# Patient Record
Sex: Female | Born: 1990
Health system: Southern US, Community
[De-identification: ages and names within clinical notes are randomized; demographics above are authoritative.]

## PROBLEM LIST (undated history)

## (undated) DIAGNOSIS — I898 Other specified noninfective disorders of lymphatic vessels and lymph nodes: Secondary | ICD-10-CM

## (undated) DIAGNOSIS — R590 Localized enlarged lymph nodes: Secondary | ICD-10-CM

## (undated) HISTORY — DX: Localized enlarged lymph nodes: R59.0

## (undated) HISTORY — DX: Other specified noninfective disorders of lymphatic vessels and lymph nodes: I89.8

---

## 2016-10-26 ENCOUNTER — Other Ambulatory Visit: Payer: Self-pay | Admitting: Student

## 2016-10-26 DIAGNOSIS — M659 Synovitis and tenosynovitis, unspecified: Secondary | ICD-10-CM

## 2016-11-03 ENCOUNTER — Ambulatory Visit: Payer: Medicaid Other

## 2016-12-15 ENCOUNTER — Encounter: Payer: Self-pay | Admitting: Radiology

## 2016-12-15 ENCOUNTER — Ambulatory Visit
Admission: RE | Admit: 2016-12-15 | Discharge: 2016-12-15 | Disposition: A | Discharge status: Home / Self Care | Payer: Medicaid Other | Source: Ambulatory Visit | Attending: Student | Admitting: Student

## 2016-12-15 DIAGNOSIS — Z09 Encounter for follow-up examination after completed treatment for conditions other than malignant neoplasm: Secondary | ICD-10-CM | POA: Diagnosis present

## 2016-12-15 DIAGNOSIS — M659 Synovitis and tenosynovitis, unspecified: Secondary | ICD-10-CM

## 2016-12-15 DIAGNOSIS — M25472 Effusion, left ankle: Secondary | ICD-10-CM | POA: Diagnosis not present

## 2017-01-01 DIAGNOSIS — M659 Synovitis and tenosynovitis, unspecified: Secondary | ICD-10-CM | POA: Insufficient documentation

## 2017-01-25 ENCOUNTER — Other Ambulatory Visit: Payer: Self-pay

## 2017-01-25 ENCOUNTER — Encounter
Admission: RE | Admit: 2017-01-25 | Discharge: 2017-01-25 | Disposition: A | Discharge status: Home / Self Care | Payer: Medicaid Other | Source: Ambulatory Visit | Attending: Surgery | Admitting: Surgery

## 2017-01-25 NOTE — Patient Instructions (Signed)
  Your procedure is scheduled on: 02-01-17 Report to Same Day Surgery 2nd floor medical mall [facility](Medical Mall Entrance-take elevator on left to 2nd floor.  Check in with surgery information desk.) To find out your arrival time please call [phone] between 1PM - 3PM on 01-31-17  Remember: Instructions that are not followed completely may result in serious medical risk, up to and including death, or upon the discretion of your surgeon and anesthesiologist your surgery may need to be rescheduled.    _x___ 1. Do not eat food after midnight the night before your procedure. NO GUM OR CANDY AFTER MIDNIGHT.You may drink clear liquids up to 2 hours before you are scheduled to arrive at the hospital for your procedure.  Do not drink clear liquids within 2 hours of your scheduled arrival to the hospital.  Clear liquids include  --Water or Apple juice without pulp  --Clear carbohydrate beverage such as ClearFast or Gatorade  --Black Coffee or Clear Tea (No milk, no creamers, do not add anything to the coffee or Tea      __x__ 2. No Alcohol for 24 hours before or after surgery.   __x__3. No Smoking for 24 prior to surgery.   ____  4. Bring all medications with you on the day of surgery if instructed.    __x__ 5. Notify your doctor if there is any change in your medical condition     (cold, fever, infections).     Do not wear jewelry, make-up, hairpins, clips or nail polish.  Do not wear lotions, powders, or perfumes. You may wear deodorant.  Do not shave 48 hours prior to surgery. Men may shave face and neck.  Do not bring valuables to the hospital.    [facility] is not responsible for any belongings or valuables.               Contacts, dentures or bridgework may not be worn into surgery.  Leave your suitcase in the car. After surgery it may be brought to your room.  For patients admitted to the hospital, discharge time is determined by your treatment team.   Patients discharged the day of  surgery will not be allowed to drive home.  You will need someone to drive you home and stay with you the night of your procedure.    ____ Take anti-hypertensive listed below, cardiac, seizure, asthma,  anti-reflux and psychiatric medicines. These include:  1. NONE  2.  3.  4.  5.  6.  ____Fleets enema or Magnesium Citrate as directed.   _x___ Use CHG Soap or sage wipes as directed on instruction sheet   ____ Use inhalers on the day of surgery and bring to hospital day of surgery  ____ Stop Metformin and Janumet 2 days prior to surgery.    ____ Take 1/2 of usual insulin dose the night before surgery and none on the morning surgery.   ____ Follow recommendations from Cardiologist, Pulmonologist or PCP regarding stopping Aspirin, Coumadin, Plavix ,Eliquis, Effient, or Pradaxa, and Pletal.  X____Stop Anti-inflammatories such as Advil, Aleve, IBUPROFEN, Motrin, Naproxen, Naprosyn, Goodies powders or aspirin products NOW-OK to take Tylenol   ____ Stop supplements until after surgery.    ____ Bring C-Pap to the hospital.

## 2017-02-01 ENCOUNTER — Other Ambulatory Visit: Payer: Self-pay

## 2017-02-01 ENCOUNTER — Ambulatory Visit: Payer: Medicaid Other | Admitting: Anesthesiology

## 2017-02-01 ENCOUNTER — Encounter
Admission: RE | Disposition: A | Discharge status: Home / Self Care | Payer: Self-pay | Source: Ambulatory Visit | Attending: Surgery

## 2017-02-01 ENCOUNTER — Encounter: Payer: Self-pay | Admitting: *Deleted

## 2017-02-01 ENCOUNTER — Ambulatory Visit
Admission: RE | Admit: 2017-02-01 | Discharge: 2017-02-01 | Disposition: A | Discharge status: Home / Self Care | Payer: Medicaid Other | Source: Ambulatory Visit | Attending: Surgery | Admitting: Surgery

## 2017-02-01 DIAGNOSIS — M659 Synovitis and tenosynovitis, unspecified: Secondary | ICD-10-CM | POA: Diagnosis not present

## 2017-02-01 DIAGNOSIS — M25872 Other specified joint disorders, left ankle and foot: Secondary | ICD-10-CM | POA: Insufficient documentation

## 2017-02-01 HISTORY — PX: ANKLE ARTHROSCOPY: SHX545

## 2017-02-01 LAB — POCT PREGNANCY, URINE: Preg Test, Ur: NEGATIVE

## 2017-02-01 SURGERY — ARTHROSCOPY, ANKLE
Anesthesia: General | Site: Ankle | Laterality: Left | Wound class: Clean

## 2017-02-01 MED ORDER — FAMOTIDINE 20 MG PO TABS
ORAL_TABLET | ORAL | Status: AC
  Administered 2017-02-01: 20 mg
  Filled 2017-02-01: qty 1

## 2017-02-01 MED ORDER — BUPIVACAINE-EPINEPHRINE (PF) 0.5% -1:200000 IJ SOLN
INTRAMUSCULAR | Status: DC | PRN
  Administered 2017-02-01: 15 mL via PERINEURAL

## 2017-02-01 MED ORDER — FAMOTIDINE 20 MG PO TABS: 20.0000 mg | ORAL_TABLET | Freq: Once | ORAL | Status: DC

## 2017-02-01 MED ORDER — LIDOCAINE HCL (PF) 2 % IJ SOLN
INTRAMUSCULAR | Status: AC
  Filled 2017-02-01: qty 10

## 2017-02-01 MED ORDER — CEFAZOLIN SODIUM-DEXTROSE 2-4 GM/100ML-% IV SOLN
INTRAVENOUS | Status: AC
  Filled 2017-02-01: qty 100

## 2017-02-01 MED ORDER — BUPIVACAINE-EPINEPHRINE (PF) 0.5% -1:200000 IJ SOLN
INTRAMUSCULAR | Status: AC
  Filled 2017-02-01: qty 30

## 2017-02-01 MED ORDER — LACTATED RINGERS IV SOLN
INTRAVENOUS | Status: DC
  Administered 2017-02-01: 12:00:00 via INTRAVENOUS

## 2017-02-01 MED ORDER — KETOROLAC TROMETHAMINE 30 MG/ML IJ SOLN
INTRAMUSCULAR | Status: AC
  Filled 2017-02-01: qty 1

## 2017-02-01 MED ORDER — ONDANSETRON HCL 4 MG/2ML IJ SOLN
INTRAMUSCULAR | Status: DC | PRN
Start: 2017-02-01 — End: 2017-02-01
  Administered 2017-02-01: 4 mg via INTRAVENOUS

## 2017-02-01 MED ORDER — PHENYLEPHRINE HCL 10 MG/ML IJ SOLN
INTRAMUSCULAR | Status: DC | PRN
  Administered 2017-02-01 (×4): 100 ug via INTRAVENOUS

## 2017-02-01 MED ORDER — FENTANYL CITRATE (PF) 100 MCG/2ML IJ SOLN: 25.0000 ug | INTRAMUSCULAR | Status: DC | PRN

## 2017-02-01 MED ORDER — LIDOCAINE HCL (PF) 1 % IJ SOLN
INTRAMUSCULAR | Status: AC
  Filled 2017-02-01: qty 30

## 2017-02-01 MED ORDER — LIDOCAINE HCL 1 % IJ SOLN
INTRAMUSCULAR | Status: DC | PRN
  Administered 2017-02-01: 15 mL

## 2017-02-01 MED ORDER — HYDROCODONE-ACETAMINOPHEN 5-325 MG PO TABS: 1.0000 | ORAL_TABLET | Freq: Four times a day (QID) | ORAL | 0 refills | Status: DC | PRN

## 2017-02-01 MED ORDER — MIDAZOLAM HCL 2 MG/2ML IJ SOLN
INTRAMUSCULAR | Status: DC | PRN
  Administered 2017-02-01: 2 mg via INTRAVENOUS

## 2017-02-01 MED ORDER — KETOROLAC TROMETHAMINE 30 MG/ML IJ SOLN
INTRAMUSCULAR | Status: DC | PRN
Start: 2017-02-01 — End: 2017-02-01
  Administered 2017-02-01: 30 mg via INTRAVENOUS

## 2017-02-01 MED ORDER — OXYCODONE HCL 5 MG PO TABS: 5.0000 mg | ORAL_TABLET | Freq: Once | ORAL | Status: DC | PRN

## 2017-02-01 MED ORDER — LIDOCAINE HCL (CARDIAC) 20 MG/ML IV SOLN
INTRAVENOUS | Status: DC | PRN
  Administered 2017-02-01: 40 mg via INTRAVENOUS

## 2017-02-01 MED ORDER — FENTANYL CITRATE (PF) 100 MCG/2ML IJ SOLN
INTRAMUSCULAR | Status: DC | PRN
  Administered 2017-02-01 (×2): 25 ug via INTRAVENOUS

## 2017-02-01 MED ORDER — DEXAMETHASONE SODIUM PHOSPHATE 10 MG/ML IJ SOLN
INTRAMUSCULAR | Status: AC
  Filled 2017-02-01: qty 1

## 2017-02-01 MED ORDER — PROPOFOL 10 MG/ML IV BOLUS
INTRAVENOUS | Status: AC
  Filled 2017-02-01: qty 20

## 2017-02-01 MED ORDER — MIDAZOLAM HCL 2 MG/2ML IJ SOLN
INTRAMUSCULAR | Status: AC
  Filled 2017-02-01: qty 2

## 2017-02-01 MED ORDER — OXYCODONE HCL 5 MG/5ML PO SOLN: 5.0000 mg | Freq: Once | ORAL | Status: DC | PRN

## 2017-02-01 MED ORDER — DEXAMETHASONE SODIUM PHOSPHATE 10 MG/ML IJ SOLN
INTRAMUSCULAR | Status: DC | PRN
Start: 2017-02-01 — End: 2017-02-01
  Administered 2017-02-01: 10 mg via INTRAVENOUS

## 2017-02-01 MED ORDER — CEFAZOLIN SODIUM-DEXTROSE 2-4 GM/100ML-% IV SOLN
2.0000 g | Freq: Once | INTRAVENOUS | Status: AC
  Administered 2017-02-01: 2 g via INTRAVENOUS

## 2017-02-01 MED ORDER — PROPOFOL 10 MG/ML IV BOLUS
INTRAVENOUS | Status: DC | PRN
  Administered 2017-02-01: 40 mg via INTRAVENOUS
  Administered 2017-02-01: 120 mg via INTRAVENOUS

## 2017-02-01 MED ORDER — ONDANSETRON HCL 4 MG/2ML IJ SOLN
INTRAMUSCULAR | Status: AC
  Filled 2017-02-01: qty 2

## 2017-02-01 MED ORDER — FENTANYL CITRATE (PF) 100 MCG/2ML IJ SOLN
INTRAMUSCULAR | Status: AC
  Filled 2017-02-01: qty 4

## 2017-02-01 MED ORDER — GLYCOPYRROLATE 0.2 MG/ML IJ SOLN
INTRAMUSCULAR | Status: DC | PRN
  Administered 2017-02-01: 0.2 mg via INTRAVENOUS

## 2017-02-01 SURGICAL SUPPLY — 38 items
ADAPTER IRRIG TUBE 2 SPIKE SOL (ADAPTER) IMPLANT
BANDAGE ELASTIC 4 LF NS (GAUZE/BANDAGES/DRESSINGS) ×2 IMPLANT
BNDG ESMARK 4X12 TAN STRL LF (GAUZE/BANDAGES/DRESSINGS) ×2 IMPLANT
CASTING MATERIAL DELTA LITE (CAST SUPPLIES) IMPLANT
CHLORAPREP W/TINT 26ML (MISCELLANEOUS) ×2 IMPLANT
CUFF TOURN 18 STER (MISCELLANEOUS) ×2 IMPLANT
CUFF TOURN 24 STER (MISCELLANEOUS) IMPLANT
CUFF TOURN 30 STER DUAL PORT (MISCELLANEOUS) IMPLANT
DRAPE INCISE IOBAN 66X45 STRL (DRAPES) ×2 IMPLANT
GAUZE PETRO XEROFOAM 1X8 (MISCELLANEOUS) IMPLANT
GAUZE SPONGE 4X4 12PLY STRL (GAUZE/BANDAGES/DRESSINGS) ×2 IMPLANT
GLOVE BIO SURGEON STRL SZ8 (GLOVE) ×4 IMPLANT
GLOVE INDICATOR 8.0 STRL GRN (GLOVE) ×2 IMPLANT
GOWN STRL REUS W/ TWL LRG LVL3 (GOWN DISPOSABLE) ×1 IMPLANT
GOWN STRL REUS W/ TWL XL LVL3 (GOWN DISPOSABLE) ×1 IMPLANT
GOWN STRL REUS W/TWL LRG LVL3 (GOWN DISPOSABLE) ×1
GOWN STRL REUS W/TWL XL LVL3 (GOWN DISPOSABLE) ×1
IV LACTATED RINGER IRRG 3000ML (IV SOLUTION) ×1
IV LR IRRIG 3000ML ARTHROMATIC (IV SOLUTION) ×1 IMPLANT
KIT RM TURNOVER STRD PROC AR (KITS) ×2 IMPLANT
LABEL OR SOLS (LABEL) IMPLANT
MANIFOLD NEPTUNE II (INSTRUMENTS) ×2 IMPLANT
NS IRRIG 500ML POUR BTL (IV SOLUTION) IMPLANT
PACK ARTHROSCOPY KNEE (MISCELLANEOUS) ×2 IMPLANT
PAD CAST CTTN 4X4 STRL (SOFTGOODS) IMPLANT
PADDING CAST COTTON 4X4 STRL (SOFTGOODS)
STRAP ANKLE FOOT DISTRACTOR (ORTHOPEDIC SUPPLIES) IMPLANT
STRAP SAFETY BODY (MISCELLANEOUS) ×2 IMPLANT
STRIP CLOSURE SKIN 1/2X4 (GAUZE/BANDAGES/DRESSINGS) IMPLANT
SUCTION FRAZIER HANDLE 10FR (MISCELLANEOUS) ×1
SUCTION TUBE FRAZIER 10FR DISP (MISCELLANEOUS) ×1 IMPLANT
SUT ETHIBOND GREEN BRAID 0S 4 (SUTURE) IMPLANT
SUT ETHIBOND NAB CT1 #1 30IN (SUTURE) IMPLANT
SUT ORTHOCORD 2X36 W/O NDL (SUTURE) IMPLANT
SUT ORTHOCORD W/MULTIPK NDL (SUTURE) IMPLANT
SUT PROLENE 4 0 PS 2 18 (SUTURE) ×2 IMPLANT
SUT VIC AB 2-0 CT2 27 (SUTURE) IMPLANT
TUBING ARTHRO INFLOW-ONLY STRL (TUBING) ×2 IMPLANT

## 2017-02-01 NOTE — Anesthesia Post-op Follow-up Note (Signed)
Anesthesia QCDR form completed.        

## 2017-02-01 NOTE — Anesthesia Procedure Notes (Signed)
Procedure Name: LMA Insertion Date/Time: 02/01/2017 1:02 PM Performed by: Dava NajjarFrazier, Zunaira Lamy, CRNA Pre-anesthesia Checklist: Patient identified, Emergency Drugs available, Suction available, Patient being monitored and Timeout performed Patient Re-evaluated:Patient Re-evaluated prior to induction Oxygen Delivery Method: Circle system utilized Preoxygenation: Pre-oxygenation with 100% oxygen Induction Type: IV induction LMA: LMA inserted LMA Size: 4.0 Number of attempts: 2 Placement Confirmation: positive ETCO2 and breath sounds checked- equal and bilateral Tube secured with: Tape Dental Injury: Teeth and Oropharynx as per pre-operative assessment

## 2017-02-01 NOTE — Op Note (Addendum)
02/01/2017  2:22 PM  Patient:   Jamie Weaver  Pre-Op Diagnosis:   Anterior impingement syndrome, left ankle.  Post-Op Diagnosis:   Same.  Procedure:   Arthroscopic debridement, left ankle.  Surgeon:   Maryagnes AmosJ. Jeffrey Poggi, MD  Assistant:   None  Anesthesia:   General LMA  Findings:   As above. The articular surfaces of both the tibia and talus were in excellent condition.  Complications:   None  EBL:   0 cc  Fluids:   450 cc crystalloid  TT:   38 min at 250 mmHg  Drains:   None  Closure:   4-0 Prolene interrupted sutures  Brief Clinical Note:   The patient is a 26 year old female with a 1+ year history of anterior left ankle pain following two separate twisting injuries while playing soccer. Her symptoms have persisted despite medications, activity modification, therapy, etc.  Her history and examination were consistent with anterior impingement syndrome. Plain radiographs and an MRI scan was unremarkable for any other associated pathology. The patient presents at this time for definitive management of her left ankle symptoms.  Procedure:   The patient was brought into the operating room and lain in the supine position. After adequate general laryngeal mask anesthesia was obtained, the left foot and lower leg were prepped with ChloraPrep solution, then draped sterilely. Preoperative antibiotics were administered. A timeout was performed to verify the appropriate surgical site before the limb was exsanguinated with an Esmarch and the calf tourniquet inflated to 250 mmHg. A 1 cm incision was made over the anterolateral aspect of the left ankle joint. The subcutaneous tissues were dissected bluntly down to the capsule before the capsule was penetrated with a small trocar and the small joint camera introduced. A second anteromedial portal was created similarly and the 2.9 full-radius resector introduced through this portal. The anteromedial aspect of the joint was inspected thoroughly.  There were no significant degenerative changes and no osteophytes or loose bodies identified. The abundant reactive synovial and scar tissues anteromedially were debrided back to stable margins using the full-radius resector.  The camera was repositioned in the anteromedial portal and the full-radius resector introduced to the anterolateral portal. Again, the abundant reactive synovial scar tissues anterolaterally were debrided back to stable margins using the full-radius resector. The arthroscopic equipment were removed from the joint after suctioning the excess fluid. The portal sites were reapproximated using 4-0 Prolene interrupted sutures before a sterile bulky dressing was applied to the ankle. The patient was then awakened, extubated, and returned to the recovery room in satisfactory condition after tolerating the procedure well.

## 2017-02-01 NOTE — H&P (Signed)
Paper H&P to be scanned into permanent record. H&P reviewed and patient re-examined. No changes. 

## 2017-02-01 NOTE — Transfer of Care (Signed)
Immediate Anesthesia Transfer of Care Note  Patient: Jamie Weaver  Procedure(s) Performed: ANKLE ARTHROSCOPY WITH DEBRIDMENT (Left Ankle)  Patient Location: PACU  Anesthesia Type:General  Level of Consciousness: drowsy and patient cooperative  Airway & Oxygen Therapy: Patient Spontanous Breathing and Patient connected to nasal cannula oxygen  Post-op Assessment: Report given to RN and Post -op Vital signs reviewed and stable  Post vital signs: Reviewed and stable  Last Vitals:  Vitals:   02/01/17 1204  BP: 112/65  Pulse: 62  Resp: 16  Temp: 36.6 C  SpO2: 100%    Last Pain:  Vitals:   02/01/17 1204  TempSrc: Tympanic  PainSc: 0-No pain         Complications: No apparent anesthesia complications

## 2017-02-01 NOTE — Anesthesia Preprocedure Evaluation (Signed)
Anesthesia Evaluation  Patient identified by MRN, date of birth, ID band Patient awake    Reviewed: Allergy & Precautions, H&P , NPO status , Patient's Chart, lab work & pertinent test results  History of Anesthesia Complications Negative for: history of anesthetic complications  Airway Mallampati: I  TM Distance: >3 FB Neck ROM: full    Dental  (+) Chipped   Pulmonary neg pulmonary ROS, neg shortness of breath,           Cardiovascular Exercise Tolerance: Good (-) angina(-) Past MI and (-) DOE negative cardio ROS       Neuro/Psych negative neurological ROS  negative psych ROS   GI/Hepatic negative GI ROS, Neg liver ROS, neg GERD  ,  Endo/Other  negative endocrine ROS  Renal/GU      Musculoskeletal   Abdominal   Peds  Hematology negative hematology ROS (+)   Anesthesia Other Findings History reviewed. No pertinent past medical history.  Past Surgical History: No date: NO PAST SURGERIES     Reproductive/Obstetrics negative OB ROS                             Anesthesia Physical Anesthesia Plan  ASA: I  Anesthesia Plan: General   Post-op Pain Management:    Induction: Intravenous  PONV Risk Score and Plan: 2 and Ondansetron, Midazolam and Dexamethasone  Airway Management Planned: LMA  Additional Equipment:   Intra-op Plan:   Post-operative Plan: Extubation in OR  Informed Consent: I have reviewed the patients History and Physical, chart, labs and discussed the procedure including the risks, benefits and alternatives for the proposed anesthesia with the patient or authorized representative who has indicated his/her understanding and acceptance.   Dental Advisory Given  Plan Discussed with: Anesthesiologist, CRNA and Surgeon  Anesthesia Plan Comments: (Patient consented for risks of anesthesia including but not limited to:  - adverse reactions to medications - damage  to teeth, lips or other oral mucosa - sore throat or hoarseness - Damage to heart, brain, lungs or loss of life  Patient voiced understanding.)        Anesthesia Quick Evaluation

## 2017-02-01 NOTE — Discharge Instructions (Addendum)
Keep dressing dry and intact.  May shower after dressing changed on post-op day #4 (Monday).  Cover sutures with Band-Aids after drying off. Apply ice frequently to ankle. Take ibuprofen 600-800 mg TID with meals for 7-10 days, then as necessary. Take pain medication as prescribed or ES Tylenol when needed.  May weight-bear as tolerated - use crutches or walker as needed. Follow-up in 10-14 days or as scheduled.  AMBULATORY SURGERY  DISCHARGE INSTRUCTIONS   1) The drugs that you were given will stay in your system until tomorrow so for the next 24 hours you should not:  A) Drive an automobile B) Make any legal decisions C) Drink any alcoholic beverage   2) You may resume regular meals tomorrow.  Today it is better to start with liquids and gradually work up to solid foods.  You may eat anything you prefer, but it is better to start with liquids, then soup and crackers, and gradually work up to solid foods.   3) Please notify your doctor immediately if you have any unusual bleeding, trouble breathing, redness and pain at the surgery site, drainage, fever, or pain not relieved by medication.    4) Additional Instructions:        Please contact your physician with any problems or Same Day Surgery at [phone], Monday through Friday 6 am to 4 pm, or [place] at [facility] Main number at [phone].

## 2017-02-02 ENCOUNTER — Encounter: Payer: Self-pay | Admitting: Surgery

## 2017-02-02 NOTE — Anesthesia Postprocedure Evaluation (Signed)
Anesthesia Post Note  Patient: Jamie Weaver  Procedure(s) Performed: ANKLE ARTHROSCOPY WITH DEBRIDMENT (Left Ankle)  Patient location during evaluation: PACU Anesthesia Type: General Level of consciousness: awake and alert Pain management: pain level controlled Vital Signs Assessment: post-procedure vital signs reviewed and stable Respiratory status: spontaneous breathing, nonlabored ventilation, respiratory function stable and patient connected to nasal cannula oxygen Cardiovascular status: blood pressure returned to baseline and stable Postop Assessment: no apparent nausea or vomiting Anesthetic complications: no     Last Vitals:  Vitals:   02/01/17 1455 02/01/17 1500  BP: 100/67 110/70  Pulse: (!) 53 72  Resp: 14 14  Temp:  36.6 C  SpO2: 100% 100%    Last Pain:  Vitals:   02/01/17 1204  TempSrc: Tympanic  PainSc: 0-No pain                 Cleda MccreedyJoseph K Piscitello

## 2017-12-27 ENCOUNTER — Ambulatory Visit: Payer: Medicaid Other | Admitting: Surgery

## 2017-12-27 ENCOUNTER — Encounter: Payer: Self-pay | Admitting: Surgery

## 2017-12-27 ENCOUNTER — Encounter: Payer: Self-pay | Admitting: *Deleted

## 2017-12-27 ENCOUNTER — Other Ambulatory Visit: Payer: Self-pay

## 2017-12-27 VITALS — BP 112/74 | HR 77 | Temp 97.7°F | Resp 18 | Ht 67.0 in | Wt 143.4 lb

## 2017-12-27 DIAGNOSIS — R591 Generalized enlarged lymph nodes: Secondary | ICD-10-CM | POA: Diagnosis not present

## 2017-12-27 DIAGNOSIS — R599 Enlarged lymph nodes, unspecified: Secondary | ICD-10-CM

## 2017-12-27 DIAGNOSIS — R590 Localized enlarged lymph nodes: Secondary | ICD-10-CM

## 2017-12-27 HISTORY — DX: Localized enlarged lymph nodes: R59.0

## 2017-12-27 NOTE — Progress Notes (Signed)
Surgical Clinic History and Physical  Referring provider:  [facility] [address] [place], [place] [id number]  HISTORY OF PRESENT ILLNESS (HPI):  27 y.o. female Jamie Weaver presents for evaluation of persistent vs possibly enlarged Right inguinal lymphadenopathy x 1 year. Patient reports she first noted 2 small Right groin masses >1 year ago and disregarded them as they were not painful or otherwise bothersome. When she recently mentioned them to a friend, she was advised to have them evaluated and mentioned them accordingly to her primary care physician. Though she previously underwent Left ankle surgery (Poggi, 2018), she denies any RLE injuries or infection, fever/chills, unintentional weight loss, or similarly masses otherwise. She likewise denies any family history of lymphoma or leukemia, but wishes to make sure they do not represent something concerning. 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  PAST MEDICAL HISTORY (PMH):  History reviewed. No pertinent past medical history.   PAST SURGICAL HISTORY (PSH):  Past Surgical History:  Procedure Laterality Date  . ANKLE ARTHROSCOPY Left 02/01/2017   Procedure: ANKLE ARTHROSCOPY WITH DEBRIDMENT;  Surgeon: Poggi, John J, MD;  Location: [facility] ORS;  Service: Orthopedics;  Laterality: Left;  . NO PAST SURGERIES      MEDICATIONS:  Prior to Admission medications   Medication Sig Start Date End Date Taking? Authorizing Provider  Levonorgestrel (SKYLA) 13.5 MG IUD 1 Dose by Intrauterine route once.   Yes [provider]    ALLERGIES:  No Known Allergies   SOCIAL HISTORY:  Social History   Socioeconomic History  . Marital status: Married    Spouse name: Not on file  . Number of children: 2  . Years of education: Not on file  . Highest education level: Not on file  Occupational History  . Occupation: Weaver   Social Needs  . Financial resource strain: Not on file  . Food insecurity:    Worry: Not on file    Inability: Not on file  . Transportation needs:    Medical: Not on file    Non-medical: Not on file  Tobacco Use  . Smoking status: Never Smoker  . Smokeless tobacco: Never Used  Substance and Sexual Activity  . Alcohol use: Yes    Frequency: Never    Comment: social   . Drug use: No  . Sexual activity: Yes  Lifestyle  . Physical activity:    Days per week: Not on file    Minutes per session: Not on file  . Stress: Not on file  Relationships  . Social connections:    Talks on phone: Not on file    Gets together: Not on file    Attends religious service: Not on file    Active member of club or organization: Not on file    Attends meetings of clubs or organizations: Not on file    Relationship status: Not on file  . Intimate partner violence:    Fear of current or ex partner: Not on file    Emotionally abused: Not on file    Physically abused: Not on file    Forced sexual activity: Not on file  Other Topics Concern  . Not on file  Social History Narrative  . Not on file    The patient currently resides (home / rehab facility / nursing home): Home The patient normally is (ambulatory / bedbound): Ambulatory  FAMILY HISTORY:    History reviewed. No pertinent family history.  Otherwise negative/non-contributory.  REVIEW OF SYSTEMS:  Constitutional: denies any other weight loss, fever, chills, or sweats  Eyes: denies any other vision changes, history of eye injury  ENT: denies sore throat, hearing problems  Respiratory: denies shortness of breath, wheezing  Cardiovascular: denies chest pain, palpitations  Gastrointestinal: denies abdominal pain, N/V, or diarrhea Musculoskeletal: denies any other joint pains or cramps  Skin: Denies any other rashes or skin discolorations except as per HPI Neurological: denies any other headache, dizziness, weakness  Psychiatric: Denies  any other depression, anxiety   All other review of systems were otherwise negative   VITAL SIGNS:  BP 112/74   Pulse 77   Temp 97.7 F (36.5 C) (Temporal)   Resp 18   Ht 5' 7" (1.702 m)   Wt 143 lb 6.4 oz (65 kg)   SpO2 99%   BMI 22.46 kg/m   PHYSICAL EXAM:  Constitutional:  -- Normal body habitus  -- Awake, alert, and oriented x3  Eyes:  -- Pupils equally round and reactive to light  -- No scleral icterus  Ear, nose, throat:  -- No jugular venous distension -- No nasal drainage, bleeding Pulmonary:  -- No crackles  -- Equal breath sounds bilaterally -- Breathing non-labored at rest Cardiovascular:  -- S1, S2 present  -- No pericardial rubs  Gastrointestinal:  -- Abdomen soft, nontender, non-distended, no guarding/rebound  -- No abdominal masses appreciated, pulsatile or otherwise  Musculoskeletal and Integumentary:  -- Wounds or skin discoloration: Right groin superficial 6 - 7 mm hard non-tender subcutaneous masses consistent with superficial lymphadenopathy without overlying erythema or drainage, no such Left groin lymphadenopathy -- Extremities: B/L UE and LE FROM, hands and feet warm, no edema  Neurologic:  -- Motor function: Intact and symmetric -- Sensation: Intact and symmetric  Labs: CBC: No results found for: WBC, RBC BMP: No results found for: GLUCOSE, POTASSIUM, CHLORIDE, CO2, BUN, CREATININE, CALCIUM   Imaging studies: No new pertinent imaging studies available for review   Assessment/Plan:  27 y.o. female with persistent vs enlarged Right inguinal lymphadenopathy/subcutaneous masses >1 year.   - differential diagnoses reviewed  - all risks, benefits, and alternatives to excision of Right inguinal subcutaneous masses/lymphadenopathy were discussed with the patient, all of her questions were answered to her expressed satisfaction, patient expresses she wishes to proceed, and informed consent was obtained.  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  Thank you for the opportunity to participate in this patient's care.  -- Creola Krotz E. Wendall Isabell, MD, RPVI [place]: [facility] General Surgery - Partnering for exceptional care. Office: [phone] 

## 2017-12-27 NOTE — Progress Notes (Signed)
Patient's surgery has been scheduled for 01[facility]04-18 at [facility] with Dr. Earlene Plateravis. 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   The patient is aware to expect a phone call from the Pre-admission Testing Department on 01-02-18 between 9 am and 1 pm for her phone interview.   The patient is aware to call the office should they have further questions.

## 2017-12-27 NOTE — H&P (View-Only) (Signed)
Surgical Clinic History and Physical  Referring provider:  [facility], [facility] [address] [place], [place] [id number]  HISTORY OF PRESENT ILLNESS (HPI):  27 y.o. female Z[place]on [place] student presents for evaluation of persistent vs possibly enlarged Right inguinal lymphadenopathy x 1 year. Patient reports she first noted 2 small Right groin masses >1 year ago and disregarded them as they were not painful or otherwise bothersome. When she recently mentioned them to a friend, she was advised to have them evaluated and mentioned them accordingly to her primary care physician. Though she previously underwent Left ankle surgery (Poggi, 2018), she denies any RLE injuries or infection, fever/chills, unintentional weight loss, or similarly masses otherwise. She likewise denies any family history of lymphoma or leukemia, but wishes to make sure they do not represent something concerning. 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  PAST MEDICAL HISTORY (PMH):  History reviewed. No pertinent past medical history.   PAST SURGICAL HISTORY (PSH):  Past Surgical History:  Procedure Laterality Date  . ANKLE ARTHROSCOPY Left 02/01/2017   Procedure: ANKLE ARTHROSCOPY WITH DEBRIDMENT;  Surgeon: Christena FlakePoggi, John J, MD;  Location: [facility] ORS;  Service: Orthopedics;  Laterality: Left;  . NO PAST SURGERIES      MEDICATIONS:  Prior to Admission medications   Medication Sig Start Date End Date Taking? Authorizing Provider  Levonorgestrel (SKYLA) 13.5 MG IUD 1 Dose by Intrauterine route once.   Yes [provider]    ALLERGIES:  No Known Allergies   SOCIAL HISTORY:  Social History   Socioeconomic History  . Marital status: Married    Spouse name: Not on file  . Number of children: 2  . Years of education: Not on file  . Highest education level: Not on file  Occupational History  . Occupation: Consulting civil engineerstudent   Social Needs  . Financial resource strain: Not on file  . Food insecurity:    Worry: Not on file    Inability: Not on file  . Transportation needs:    Medical: Not on file    Non-medical: Not on file  Tobacco Use  . Smoking status: Never Smoker  . Smokeless tobacco: Never Used  Substance and Sexual Activity  . Alcohol use: Yes    Frequency: Never    Comment: social   . Drug use: No  . Sexual activity: Yes  Lifestyle  . Physical activity:    Days per week: Not on file    Minutes per session: Not on file  . Stress: Not on file  Relationships  . Social connections:    Talks on phone: Not on file    Gets together: Not on file    Attends religious service: Not on file    Active member of club or organization: Not on file    Attends meetings of clubs or organizations: Not on file    Relationship status: Not on file  . Intimate partner violence:    Fear of current or ex partner: Not on file    Emotionally abused: Not on file    Physically abused: Not on file    Forced sexual activity: Not on file  Other Topics Concern  . Not on file  Social History Narrative  . Not on file    The patient currently resides (home / rehab facility / nursing home): Home The patient normally is (ambulatory / bedbound): Ambulatory  FAMILY HISTORY:  History reviewed. No pertinent family history.  Otherwise negative/non-contributory.  REVIEW OF SYSTEMS:  Constitutional: denies any other weight loss, fever, chills, or sweats  Eyes: denies any other vision changes, history of eye injury  ENT: denies sore throat, hearing problems  Respiratory: denies shortness of breath, wheezing  Cardiovascular: denies chest pain, palpitations  Gastrointestinal: denies abdominal pain, N/V, or diarrhea Musculoskeletal: denies any other joint pains or cramps  Skin: Denies any other rashes or skin discolorations except as per HPI Neurological: denies any other headache, dizziness, weakness  Psychiatric: Denies  any other depression, anxiety   All other review of systems were otherwise negative   VITAL SIGNS:  BP 112/74   Pulse 77   Temp 97.7 F (36.5 C) (Temporal)   Resp 18   Ht 5\' 7"  (1.702 m)   Wt 143 lb 6.4 oz (65 kg)   SpO2 99%   BMI 22.46 kg/m   PHYSICAL EXAM:  Constitutional:  -- Normal body habitus  -- Awake, alert, and oriented x3  Eyes:  -- Pupils equally round and reactive to light  -- No scleral icterus  Ear, nose, throat:  -- No jugular venous distension -- No nasal drainage, bleeding Pulmonary:  -- No crackles  -- Equal breath sounds bilaterally -- Breathing non-labored at rest Cardiovascular:  -- S1, S2 present  -- No pericardial rubs  Gastrointestinal:  -- Abdomen soft, nontender, non-distended, no guarding/rebound  -- No abdominal masses appreciated, pulsatile or otherwise  Musculoskeletal and Integumentary:  -- Wounds or skin discoloration: Right groin superficial 6 - 7 mm hard non-tender subcutaneous masses consistent with superficial lymphadenopathy without overlying erythema or drainage, no such Left groin lymphadenopathy -- Extremities: B/L UE and LE FROM, hands and feet warm, no edema  Neurologic:  -- Motor function: Intact and symmetric -- Sensation: Intact and symmetric  Labs: CBC: No results found for: WBC, RBC BMP: No results found for: GLUCOSE, POTASSIUM, CHLORIDE, CO2, BUN, CREATININE, CALCIUM   Imaging studies: No new pertinent imaging studies available for review   Assessment/Plan:  27 y.o. female with persistent vs enlarged Right inguinal lymphadenopathy/subcutaneous masses >1 year.   - differential diagnoses reviewed  - all risks, benefits, and alternatives to excision of Right inguinal subcutaneous masses/lymphadenopathy were discussed with the patient, all of her questions were answered to her expressed satisfaction, patient expresses she wishes to proceed, and informed consent was obtained.  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  Thank you for the opportunity to participate in this patient's care.  -- Scherrie Gerlach Earlene Plater, MD, RPVI [place]: [facility] General Surgery - Partnering for exceptional care. Office: [phone]

## 2017-12-27 NOTE — Patient Instructions (Addendum)
Patient needs to be scheduled for surgery with Dr.Davis for next Friday 01/04/18.  Patient needs to follow up after surgery for results. Please call the office with any questions or concerns.

## 2018-01-02 ENCOUNTER — Encounter
Admission: RE | Admit: 2018-01-02 | Discharge: 2018-01-02 | Disposition: A | Discharge status: Home / Self Care | Payer: Medicaid Other | Source: Ambulatory Visit | Attending: Surgery | Admitting: Surgery

## 2018-01-02 ENCOUNTER — Other Ambulatory Visit: Payer: Self-pay

## 2018-01-02 NOTE — Patient Instructions (Signed)
Your procedure is scheduled on: 01-04-18 FRIDAY Report to Same Day Surgery 2nd floor medical mall [facility](Medical Mall Entrance-take elevator on left to 2nd floor.  Check in with surgery information desk.) To find out your arrival time please call [phone] between 1PM - 3PM on 01-03-18 THURSDAY  Remember: Instructions that are not followed completely may result in serious medical risk, up to and including death, or upon the discretion of your surgeon and anesthesiologist your surgery may need to be rescheduled.    _x___ 1. Do not eat food after midnight the night before your procedure. NO GUM OR CANDY AFTER MIDNIGHT.  You may drink clear liquids up to 2 hours before you are scheduled to arrive at the hospital for your procedure.  Do not drink clear liquids within 2 hours of your scheduled arrival to the hospital.  Clear liquids include  --Water or Apple juice without pulp  --Clear carbohydrate beverage such as ClearFast or Gatorade  --Black Coffee or Clear Tea (No milk, no creamers, do not add anything to the coffee or Tea   ____Ensure clear carbohydrate drink on the way to the hospital for bariatric patients  ____Ensure clear carbohydrate drink 3 hours before surgery for Dr Rutherford NailByrnett's patients if physician instructed.     __x__ 2. No Alcohol for 24 hours before or after surgery.   __x__3. No Smoking or e-cigarettes for 24 prior to surgery.  Do not use any chewable tobacco products for at least 6 hour prior to surgery   ____  4. Bring all medications with you on the day of surgery if instructed.    __x__ 5. Notify your doctor if there is any change in your medical condition     (cold, fever, infections).    x___6. On the morning of surgery brush your teeth with toothpaste and water.  You may rinse your mouth with mouth wash if you wish.  Do not swallow any toothpaste or mouthwash.   Do not wear jewelry, make-up, hairpins, clips or nail polish.  Do not wear lotions, powders, or perfumes.  You may wear deodorant.  Do not shave 48 hours prior to surgery. Men may shave face and neck.  Do not bring valuables to the hospital.    [facility] is not responsible for any belongings or valuables.               Contacts, dentures or bridgework may not be worn into surgery.  Leave your suitcase in the car. After surgery it may be brought to your room.  For patients admitted to the hospital, discharge time is determined by your treatment team.  _  Patients discharged the day of surgery will not be allowed to drive home.  You will need someone to drive you home and stay with you the night of your procedure.    Please read over the following fact sheets that you were given:   [facility] Preparing for [facility]   ____ Take anti-hypertensive listed below, cardiac, seizure, asthma, anti-reflux and psychiatric medicines. These include:  1. NONE  2.  3.  4.  5.  6.  ____Fleets enema or Magnesium Citrate as directed.   _x___ Use CHG Soap or sage wipes as directed on instruction sheet   ____ Use inhalers on the day of surgery and bring to hospital day of surgery  ____ Stop Metformin and Janumet 2 days prior to surgery.    ____ Take 1/2 of usual insulin dose the night before surgery and none  on the morning surgery.   ____ Follow recommendations from Cardiologist, Pulmonologist or PCP regarding stopping Aspirin, Coumadin, Plavix ,Eliquis, Effient, or Pradaxa, and Pletal.  X____Stop Anti-inflammatories such as Advil, Aleve, Ibuprofen, Motrin, Naproxen, Naprosyn, Goodies powders or aspirin products NOW-OK to take Tylenol    ____ Stop supplements until after surgery.     ____ Bring C-Pap to the hospital.

## 2018-01-03 ENCOUNTER — Encounter
Admission: RE | Admit: 2018-01-03 | Discharge: 2018-01-03 | Disposition: A | Discharge status: Home / Self Care | Payer: Medicaid Other | Source: Ambulatory Visit | Attending: Surgery | Admitting: Surgery

## 2018-01-03 DIAGNOSIS — R599 Enlarged lymph nodes, unspecified: Secondary | ICD-10-CM

## 2018-01-03 DIAGNOSIS — R591 Generalized enlarged lymph nodes: Secondary | ICD-10-CM

## 2018-01-03 DIAGNOSIS — Z01812 Encounter for preprocedural laboratory examination: Secondary | ICD-10-CM | POA: Diagnosis not present

## 2018-01-03 LAB — CBC WITH DIFFERENTIAL/PLATELET
ABS IMMATURE GRANULOCYTES: 0.01 10*3/uL (ref 0.00–0.07)
BASOS ABS: 0 10*3/uL (ref 0.0–0.1)
Basophils Relative: 1 %
Eosinophils Absolute: 0.1 10*3/uL (ref 0.0–0.5)
Eosinophils Relative: 1 %
HCT: 40.1 % (ref 36.0–46.0)
Hemoglobin: 13.4 g/dL (ref 12.0–15.0)
IMMATURE GRANULOCYTES: 0 %
Lymphocytes Relative: 26 %
Lymphs Abs: 1.6 10*3/uL (ref 0.7–4.0)
MCH: 29.7 pg (ref 26.0–34.0)
MCHC: 33.4 g/dL (ref 30.0–36.0)
MCV: 88.9 fL (ref 80.0–100.0)
Monocytes Absolute: 0.4 10*3/uL (ref 0.1–1.0)
Monocytes Relative: 6 %
NEUTROS ABS: 4.1 10*3/uL (ref 1.7–7.7)
NEUTROS PCT: 66 %
Platelets: 235 10*3/uL (ref 150–400)
RBC: 4.51 MIL/uL (ref 3.87–5.11)
RDW: 13.2 % (ref 11.5–15.5)
WBC: 6.2 10*3/uL (ref 4.0–10.5)
nRBC: 0 % (ref 0.0–0.2)

## 2018-01-03 LAB — BASIC METABOLIC PANEL
ANION GAP: 6 (ref 5–15)
BUN: 10 mg/dL (ref 6–20)
CO2: 27 mmol/L (ref 22–32)
Calcium: 9.3 mg/dL (ref 8.9–10.3)
Chloride: 106 mmol/L (ref 98–111)
Creatinine, Ser: 0.85 mg/dL (ref 0.44–1.00)
GFR calc Af Amer: >60 mL/min (ref >60)
GFR calc non Af Amer: >60 mL/min (ref >60)
GLUCOSE: 97 mg/dL (ref 70–99)
POTASSIUM: 3.7 mmol/L (ref 3.5–5.1)
Sodium: 139 mmol/L (ref 135–145)

## 2018-01-03 MED ORDER — CHLORHEXIDINE GLUCONATE CLOTH 2 % EX PADS
6.0000 | MEDICATED_PAD | Freq: Once | CUTANEOUS | Status: DC
  Filled 2018-01-03: qty 6

## 2018-01-04 ENCOUNTER — Ambulatory Visit: Payer: Medicaid Other | Admitting: Anesthesiology

## 2018-01-04 ENCOUNTER — Ambulatory Visit
Admission: RE | Admit: 2018-01-04 | Discharge: 2018-01-04 | Disposition: A | Discharge status: Home / Self Care | Payer: Medicaid Other | Source: Ambulatory Visit | Attending: Surgery | Admitting: Surgery

## 2018-01-04 ENCOUNTER — Encounter
Admission: RE | Disposition: A | Discharge status: Home / Self Care | Payer: Self-pay | Source: Ambulatory Visit | Attending: Surgery

## 2018-01-04 ENCOUNTER — Other Ambulatory Visit: Payer: Self-pay

## 2018-01-04 ENCOUNTER — Encounter: Payer: Self-pay | Admitting: *Deleted

## 2018-01-04 DIAGNOSIS — R591 Generalized enlarged lymph nodes: Secondary | ICD-10-CM

## 2018-01-04 DIAGNOSIS — R590 Localized enlarged lymph nodes: Secondary | ICD-10-CM | POA: Insufficient documentation

## 2018-01-04 DIAGNOSIS — R599 Enlarged lymph nodes, unspecified: Secondary | ICD-10-CM

## 2018-01-04 HISTORY — PX: INGUINAL LYMPH NODE BIOPSY: SHX5865

## 2018-01-04 LAB — POCT PREGNANCY, URINE: Preg Test, Ur: NEGATIVE

## 2018-01-04 SURGERY — BIOPSY, LYMPH NODE, INGUINAL, OPEN
Anesthesia: Monitor Anesthesia Care | Laterality: Right

## 2018-01-04 MED ORDER — ACETAMINOPHEN 500 MG PO TABS
ORAL_TABLET | ORAL | Status: AC
  Administered 2018-01-04: 1000 mg via ORAL
  Filled 2018-01-04: qty 2

## 2018-01-04 MED ORDER — ACETAMINOPHEN 500 MG PO TABS
1000.0000 mg | ORAL_TABLET | ORAL | Status: AC
  Administered 2018-01-04: 1000 mg via ORAL

## 2018-01-04 MED ORDER — FENTANYL CITRATE (PF) 100 MCG/2ML IJ SOLN: 25.0000 ug | INTRAMUSCULAR | Status: DC | PRN

## 2018-01-04 MED ORDER — FENTANYL CITRATE (PF) 100 MCG/2ML IJ SOLN
INTRAMUSCULAR | Status: AC
  Filled 2018-01-04: qty 2

## 2018-01-04 MED ORDER — DEXMEDETOMIDINE HCL 200 MCG/2ML IV SOLN
INTRAVENOUS | Status: DC | PRN
  Administered 2018-01-04: 4 ug via INTRAVENOUS
  Administered 2018-01-04: 8 ug via INTRAVENOUS

## 2018-01-04 MED ORDER — PHENYLEPHRINE HCL 10 MG/ML IJ SOLN
INTRAMUSCULAR | Status: DC | PRN
  Administered 2018-01-04: 50 ug via INTRAVENOUS

## 2018-01-04 MED ORDER — LIDOCAINE HCL 1 % IJ SOLN
INTRAMUSCULAR | Status: DC | PRN
  Administered 2018-01-04: 10 mL

## 2018-01-04 MED ORDER — PROPOFOL 500 MG/50ML IV EMUL
INTRAVENOUS | Status: DC | PRN
  Administered 2018-01-04: 100 ug/kg/min via INTRAVENOUS

## 2018-01-04 MED ORDER — BUPIVACAINE HCL (PF) 0.5 % IJ SOLN
INTRAMUSCULAR | Status: AC
  Filled 2018-01-04: qty 30

## 2018-01-04 MED ORDER — BUPIVACAINE HCL (PF) 0.5 % IJ SOLN
INTRAMUSCULAR | Status: DC | PRN
  Administered 2018-01-04: 10 mL

## 2018-01-04 MED ORDER — LIDOCAINE HCL (PF) 1 % IJ SOLN
INTRAMUSCULAR | Status: AC
  Filled 2018-01-04: qty 30

## 2018-01-04 MED ORDER — PROPOFOL 10 MG/ML IV BOLUS
INTRAVENOUS | Status: AC
  Filled 2018-01-04: qty 20

## 2018-01-04 MED ORDER — MIDAZOLAM HCL 2 MG/2ML IJ SOLN
INTRAMUSCULAR | Status: DC | PRN
  Administered 2018-01-04: 2 mg via INTRAVENOUS

## 2018-01-04 MED ORDER — MIDAZOLAM HCL 2 MG/2ML IJ SOLN
INTRAMUSCULAR | Status: AC
  Filled 2018-01-04: qty 2

## 2018-01-04 MED ORDER — LACTATED RINGERS IV SOLN
INTRAVENOUS | Status: DC
  Administered 2018-01-04: 15:00:00 via INTRAVENOUS

## 2018-01-04 MED ORDER — CEFAZOLIN SODIUM-DEXTROSE 2-4 GM/100ML-% IV SOLN
2.0000 g | INTRAVENOUS | Status: AC
  Administered 2018-01-04: 2 g via INTRAVENOUS

## 2018-01-04 MED ORDER — FENTANYL CITRATE (PF) 100 MCG/2ML IJ SOLN
INTRAMUSCULAR | Status: DC | PRN
  Administered 2018-01-04: 50 ug via INTRAVENOUS

## 2018-01-04 MED ORDER — CEFAZOLIN SODIUM-DEXTROSE 2-4 GM/100ML-% IV SOLN
INTRAVENOUS | Status: AC
  Filled 2018-01-04: qty 100

## 2018-01-04 MED ORDER — FAMOTIDINE 20 MG PO TABS
20.0000 mg | ORAL_TABLET | Freq: Once | ORAL | Status: AC
  Administered 2018-01-04: 20 mg via ORAL

## 2018-01-04 MED ORDER — ONDANSETRON HCL 4 MG/2ML IJ SOLN: 4.0000 mg | Freq: Once | INTRAMUSCULAR | Status: DC | PRN

## 2018-01-04 MED ORDER — FAMOTIDINE 20 MG PO TABS
ORAL_TABLET | ORAL | Status: AC
  Administered 2018-01-04: 20 mg via ORAL
  Filled 2018-01-04: qty 1

## 2018-01-04 SURGICAL SUPPLY — 17 items
CHLORAPREP W/TINT 10.5 ML (MISCELLANEOUS) ×2 IMPLANT
COVER WAND RF STERILE (DRAPES) IMPLANT
DERMABOND ADVANCED (GAUZE/BANDAGES/DRESSINGS) ×1
DERMABOND ADVANCED .7 DNX12 (GAUZE/BANDAGES/DRESSINGS) ×1 IMPLANT
DRAPE LAPAROTOMY 100X77 ABD (DRAPES) ×2 IMPLANT
ELECT REM PT RETURN 9FT ADLT (ELECTROSURGICAL) ×2
ELECTRODE REM PT RTRN 9FT ADLT (ELECTROSURGICAL) ×1 IMPLANT
GLOVE BIO SURGEON STRL SZ7 (GLOVE) ×2 IMPLANT
GLOVE BIO SURGEON STRL SZ7.5 (GLOVE) ×2 IMPLANT
GOWN STRL REUS W/ TWL LRG LVL3 (GOWN DISPOSABLE) ×1 IMPLANT
GOWN STRL REUS W/TWL LRG LVL3 (GOWN DISPOSABLE) ×1
NEEDLE HYPO 25X1 1.5 SAFETY (NEEDLE) ×2 IMPLANT
PACK BASIN MINOR ARMC (MISCELLANEOUS) ×2 IMPLANT
SUT MNCRL AB 4-0 PS2 18 (SUTURE) IMPLANT
SUT VIC AB 3-0 SH 27 (SUTURE) ×1
SUT VIC AB 3-0 SH 27X BRD (SUTURE) ×1 IMPLANT
SYR 10ML LL (SYRINGE) ×2 IMPLANT

## 2018-01-04 NOTE — Transfer of Care (Signed)
Immediate Anesthesia Transfer of Care Note  Patient: Jamie Weaver  Procedure(s) Performed: INGUINAL LYMPH NODE BIOPSY (Right )  Patient Location: PACU  Anesthesia Type:General  Level of Consciousness: drowsy and patient cooperative  Airway & Oxygen Therapy: Patient Spontanous Breathing  Post-op Assessment: Report given to RN and Post -op Vital signs reviewed and stable  Post vital signs: Reviewed and stable  Last Vitals:  Vitals Value Taken Time  BP 91/64 01/04/2018  4:31 PM  Temp    Pulse 57 01/04/2018  4:32 PM  Resp 14 01/04/2018  4:32 PM  SpO2 100 % 01/04/2018  4:32 PM  Vitals shown include unvalidated device data.  Last Pain:  Vitals:   01/04/18 1438  PainSc: 0-No pain         Complications: No apparent anesthesia complications

## 2018-01-04 NOTE — Discharge Instructions (Addendum)
In addition to included general post-operative instructions for Open Right Inguinal Lymph Node Biopsy,  Diet: Resume home heart healthy diet.   Activity: No heavy lifting >20 pounds (children, pets, laundry, garbage) or strenuous activity until follow-up, but light activity and walking are encouraged. Do not drive or drink alcohol if taking narcotic pain medications.  Wound care: 2 days after surgery (afternoon of Sunday 11/24), you may shower/get incision wet with soapy water and pat dry (do not rub incisions), but no baths or submerging incision underwater until follow-up.   Medications: Resume all home medications. For mild to moderate pain: acetaminophen (Tylenol) or ibuprofen/naproxen (if no kidney disease). Combining Tylenol with alcohol can substantially increase your risk of causing liver disease.  Call office [phone] at any time if any questions, worsening pain, fevers/chills, bleeding, drainage from incision site, or other concerns.   AMBULATORY SURGERY  DISCHARGE INSTRUCTIONS   1) The drugs that you were given will stay in your system until tomorrow so for the next 24 hours you should not:  A) Drive an automobile B) Make any legal decisions C) Drink any alcoholic beverage   2) You may resume regular meals tomorrow.  Today it is better to start with liquids and gradually work up to solid foods.  You may eat anything you prefer, but it is better to start with liquids, then soup and crackers, and gradually work up to solid foods.   3) Please notify your doctor immediately if you have any unusual bleeding, trouble breathing, redness and pain at the surgery site, drainage, fever, or pain not relieved by medication.    4) Additional Instructions:        Please contact your physician with any problems or Same Day Surgery at [phone], Monday through Friday 6 am to 4 pm, or [place] at [facility] Main number at [phone].

## 2018-01-04 NOTE — Anesthesia Postprocedure Evaluation (Signed)
Anesthesia Post Note  Patient: Jamie Weaver  Procedure(s) Performed: INGUINAL LYMPH NODE BIOPSY (Right )  Patient location during evaluation: PACU Anesthesia Type: General Level of consciousness: awake and alert Pain management: pain level controlled Vital Signs Assessment: post-procedure vital signs reviewed and stable Respiratory status: spontaneous breathing, nonlabored ventilation, respiratory function stable and patient connected to nasal cannula oxygen Cardiovascular status: blood pressure returned to baseline and stable Postop Assessment: no apparent nausea or vomiting Anesthetic complications: no     Last Vitals:  Vitals:   01/04/18 1645 01/04/18 1700  BP: (!) 94/58   Pulse: 62   Resp: 12   Temp:  37.1 C  SpO2: 100%     Last Pain:  Vitals:   01/04/18 1700  PainSc: 0-No pain                 Cleda MccreedyJoseph K Piscitello

## 2018-01-04 NOTE — Anesthesia Preprocedure Evaluation (Signed)
Anesthesia Evaluation  Patient identified by MRN, date of birth, ID band Patient awake    Reviewed: Allergy & Precautions, NPO status , Patient's Chart, lab work & pertinent test results, reviewed documented beta blocker date and time   Airway Mallampati: II  TM Distance: >3 FB     Dental  (+) Chipped   Pulmonary           Cardiovascular      Neuro/Psych    GI/Hepatic   Endo/Other    Renal/GU      Musculoskeletal   Abdominal   Peds  Hematology   Anesthesia Other Findings   Reproductive/Obstetrics                             Anesthesia Physical Anesthesia Plan  ASA: II  Anesthesia Plan: MAC   Post-op Pain Management:    Induction:   PONV Risk Score and Plan:   Airway Management Planned:   Additional Equipment:   Intra-op Plan:   Post-operative Plan:   Informed Consent: I have reviewed the patients History and Physical, chart, labs and discussed the procedure including the risks, benefits and alternatives for the proposed anesthesia with the patient or authorized representative who has indicated his/her understanding and acceptance.     Plan Discussed with: CRNA  Anesthesia Plan Comments:         Anesthesia Quick Evaluation  

## 2018-01-04 NOTE — Interval H&P Note (Signed)
History and Physical Interval Note:  01/04/2018 2:43 PM  Jamie Weaver  has presented today for surgery, with the diagnosis of PERSISTENT RIGHT INGUINAL LYMPHADENOPATHY  The various methods of treatment have been discussed with the patient and family. After consideration of risks, benefits and other options for treatment, the patient has consented to  Procedure(s): INGUINAL LYMPH NODE BIOPSY (Right) as a surgical intervention .  The patient's history has been reviewed, patient examined, no change in status, stable for surgery.  I have reviewed the patient's chart and labs.  Questions were answered to the patient's satisfaction.     Ancil LinseyJason Evan Mahki Spikes

## 2018-01-04 NOTE — Op Note (Signed)
SURGICAL OPERATIVE REPORT  DATE OF PROCEDURE: 01/04/2018  ATTENDING Surgeon(s): Ancil Linseyavis, Viha Kriegel Evan, MD  ANESTHESIA: IV sedation   PRE-OPERATIVE DIAGNOSIS: Persistent Right inguinal  lymphadenopathy (icd-10's: R59.0)  POST-OPERATIVE DIAGNOSIS: Persistent Right inguinal lymphadenopathy (icd-10's: R59.0)  PROCEDURE(S):  1.) Right inguinal lymphadenectomy x 2 (cpt: 38500)  INTRAOPERATIVE FINDINGS: Easily palpable enlarged Right inguinal lymph nodes x2   INTRAVENOUS FLUIDS: 600 mL crystalloid   ESTIMATED BLOOD LOSS: Minimal (< 20 mL)  URINE OUTPUT: No Foley catheter   SPECIMENS: Persistent enlarged subfascial Right inguinal lymph nodes x2  IMPLANTS: None  DRAINS: none  COMPLICATIONS: None apparent  CONDITION AT END OF PROCEDURE: Hemodynamically stable and awake  DISPOSITION OF PATIENT: PACU  INDICATIONS FOR PROCEDURE:  Patient is a 27 y.o. female who presented for evaluation and biopsy of persistent enlarged Right inguinal lymphadenopathy >1 year. All risks, benefits, and alternatives to excisional biopsy of Right inguinal lymphadenopathy (selective lymphadenectomy) were discussed with the patient, all of patient's questions were answered to her expressed satisfaction, and informed consent was obtained and documented.  DETAILS OF PROCEDURE: Patient was brought to the operating suite and appropriately identified. IV sedation was administered along with appropriate pre-operative antibiotics without LMA or endotracheal intubation. In supine position, operative site was prepped and draped in the usual sterile fashion, and following a brief time out, local anesthetic was injected over planned incision sites at marked palpable Right inguinal lymph nodes. 2 cm incisions x2 were made using #15 blade scalpel and extended deep using combined blunt dissection and selective electrocautery. Any identified lymphatics and small veins were ligated with 3-0 silk suture ties. Palpable and focally  enlarged lymph nodes were identified, one within each of the two incision sites, and lymphovascular pedicles were each isolated, ligated with 3-0 silk suture ties, and handed off field for pathology processing, including assessment for lymphoma. Hemostasis was confirmed, additional local anesthetic was injected subcutaneously, and skin was re-approximated in layers with buried interrupted 3-0 Vicryl sutures for both fascia and dermis and interrupted subcuticular 4-0 Monocryl suture where needed to reapproximate epidermis.  Patient was then safely able to be awakened and transferred to PACU for post-operative monitoring and care.  I was present for all aspects of the above procedure, and no operative complications were apparent.

## 2018-01-04 NOTE — Anesthesia Post-op Follow-up Note (Signed)
Anesthesia QCDR form completed.        

## 2018-01-05 ENCOUNTER — Encounter: Payer: Self-pay | Admitting: Surgery

## 2018-01-08 LAB — SURGICAL PATHOLOGY

## 2018-01-15 ENCOUNTER — Other Ambulatory Visit: Payer: Self-pay

## 2018-01-15 ENCOUNTER — Encounter: Payer: Self-pay | Admitting: Surgery

## 2018-01-15 ENCOUNTER — Ambulatory Visit (INDEPENDENT_AMBULATORY_CARE_PROVIDER_SITE_OTHER): Payer: Medicaid Other | Admitting: Surgery

## 2018-01-15 VITALS — BP 122/79 | HR 69 | Temp 97.7°F | Ht 67.0 in | Wt 148.0 lb

## 2018-01-15 DIAGNOSIS — R590 Localized enlarged lymph nodes: Secondary | ICD-10-CM | POA: Diagnosis not present

## 2018-01-15 NOTE — Progress Notes (Signed)
Surgical Clinic Progress/Follow-up Note   HPI:  27 y.o. Female presents to clinic for post-op follow-up 11 Days s/p Right inguinal lymphadenectomy x2 Earlene Plater, 01/04/2018) for persistent Right inguinal lymphadenopathy >1 year. Patient reports non-painful large Right groin swelling that she describes has just begun to decrease in size over the past couple of days. She otherwise has been tolerating regular diet with +flatus and normal BM's, denies N/V, fever/chills, CP, or SOB.  Review of Systems:  Constitutional: denies fever/chills  Respiratory: denies shortness of breath, wheezing  Cardiovascular: denies chest pain, palpitations  Gastrointestinal: abdominal pain, N/V, and bowel function as per interval history Skin: Denies any other rashes or skin discolorations except post-surgical wounds as per interval history  Vital Signs:  BP 122/79   Pulse 69   Temp 97.7 F (36.5 C) (Skin)   Ht 5\' 7"  (1.702 m)   Wt 148 lb (67.1 kg)   SpO2 98%   BMI 23.18 kg/m    Physical Exam:  Constitutional:  -- Normal body habitus  -- Awake, alert, and oriented x3  Pulmonary:  -- No crackles -- Equal breath sounds bilaterally -- Breathing non-labored at rest Cardiovascular:  -- S1, S2 present  -- No pericardial rubs  Gastrointestinal:  -- Soft and non-distended, non-tender to palpation, no guarding/rebound tenderness -- No abdominal masses appreciated, pulsatile or otherwise  Musculoskeletal / Integumentary:  -- Wounds or skin discoloration: Non-tender to palpation Right groin lymphocele vs less likely seroma with post-surgical wounds well-approximated without surrounding erythema or drainage -- Extremities: B/L UE and LE FROM, hands and feet warm, no edema   Imaging: No new pertinent imaging available for review  Surgical Pathology (01/04/2018): LYMPH NODES, RIGHT INGUINAL; EXCISION:  - TWO BENIGN REACTIVE LYMPH NODES WITH FEATURES CONSISTENT WITH  DERMATOPATHIC LYMPHADENOPATHY.   Comment:   Dermatopathic lymphadenopathy is typically associated with chronic  inflammatory skin disorders, but some patients have no evident skin  problems. There is no granulomatous inflammation or neoplastic process.  Assessment:  27 y.o. yo Female with a problem list including...  Patient Active Problem List   Diagnosis Date Noted  . Right Inguinal lymphadenopathy 12/27/2017    presents to clinic for post-op follow-up evaluation, doing overall well despite resolving non-painful Right inguinal lymphocele vs less likely seroma 11 Days s/p Right inguinal lymphadenectomy x2 Earlene Plater, 01/04/2018) for persistent Right inguinal lymphadenopathy >1 year.  Plan:              - advance diet as tolerated  - surgical pathology results were discussed             - okay to submerge incisions under water (baths, swimming) prn             - gradually resume all activities without restrictions over next 2 weeks             - apply sunblock particularly to incisions with sun exposure to reduce pigmentation of scars  - discussed allowing Right inguinal lymphocele to further improve without intervention vs in-office percutaneous drainage with small risk of infection, after which patient expresses preference to continue allowing it to resolve on its own, but agrees to call if painful or not improving             - return to clinic as needed, instructed to call office if any questions or concerns  All of the above recommendations were discussed with the patient, and all of patient's questions were answered to her expressed satisfaction.  -- Scherrie Gerlach  Earlene Plateravis, MD, RPVI [place]: [facility] General Surgery - Partnering for exceptional care. Office: [phone]

## 2018-01-15 NOTE — Patient Instructions (Signed)
Return as needed. The patient is aware to call back for any questions or concern.

## 2018-01-17 ENCOUNTER — Ambulatory Visit: Payer: Medicaid Other | Admitting: Surgery

## 2018-02-14 ENCOUNTER — Encounter: Payer: Self-pay | Admitting: Surgery

## 2018-02-14 ENCOUNTER — Ambulatory Visit (INDEPENDENT_AMBULATORY_CARE_PROVIDER_SITE_OTHER): Payer: Medicaid Other | Admitting: Surgery

## 2018-02-14 ENCOUNTER — Other Ambulatory Visit: Payer: Self-pay

## 2018-02-14 DIAGNOSIS — L7634 Postprocedural seroma of skin and subcutaneous tissue following other procedure: Secondary | ICD-10-CM

## 2018-02-14 NOTE — Procedures (Signed)
SURGICAL PROCEDURE REPORT  DATE OF PROCEDURE: 02/14/2018  ATTENDING: Barbara CowerJason E. Earlene Plateravis, MD  ANESTHESIA: Local  PRE-OPERATIVE DIAGNOSIS: Right groin post-lymphadenectomy seroma (icd-10's: L76.34)  POST-OPERATIVE DIAGNOSIS: Right groin post-lymphadenectomy seroma (icd-10's: L76.34)  PROCEDURE(S):  1.) Needle aspiration of Right groin post-lymphadenectomy seroma (cpt: 10160)  INTRAOPERATIVE FINDINGS: 50 mL clear yellow non-cloudy/non-purulent fluid with immediate relief of pressure, small amount of serosanguinous fluid at completion  INTRAVENOUS FLUIDS: 0 mL crystalloid   ESTIMATED BLOOD LOSS: Minimal (<10 mL)  SPECIMENS: None  IMPLANTS: None  DRAINS: None  COMPLICATIONS: None apparent  CONDITION AT END OF PROCEDURE: Hemodynamically stable and awake  DISPOSITION OF PATIENT: Home following in-office procedure  INDICATIONS FOR PROCEDURE:  Patient is a 28 y.o. female who presented to outpatient surgical office today, 1.5 months s/p Right inguinal lymphadenectomy for persistent Right inguinal seroma that she described had improved somewhat, but despite her wound having healed very well, has continued to cause her "pressure" and discomfort, for which she requested drainage. All risks, benefits, and alternatives to needle aspiration were discussed with patient, all of patient's questions were answered to her expressed satisfaction, and informed consent was obtained and documented.  DETAILS OF PROCEDURE: Patient was brought to the procedure suite and appropriately identified. Operative site was prepped and draped in the usual sterile fashion, and following a brief time out, a wheel of local anesthetic was injected using a 25G tuberculin needle over site of maximal seroma, and seroma was aspirated using a 22G needle with immediate relief from pre-procedural pressure. Direct pressure was applied, and a small adhesive bandage was applied. Patient tolerated the procedure well.  I was present  for all aspects of the above procedure, and no operative complications were apparent.

## 2018-02-14 NOTE — Patient Instructions (Signed)
Return as needed.The patient is aware to call back for any questions or concerns.  

## 2018-02-19 ENCOUNTER — Other Ambulatory Visit: Payer: Self-pay

## 2018-02-19 ENCOUNTER — Encounter: Payer: Self-pay | Admitting: Surgery

## 2018-02-19 ENCOUNTER — Ambulatory Visit (INDEPENDENT_AMBULATORY_CARE_PROVIDER_SITE_OTHER): Payer: Medicaid Other | Admitting: Surgery

## 2018-02-19 VITALS — BP 125/81 | HR 66 | Temp 98.0°F | Ht 67.0 in | Wt 145.0 lb

## 2018-02-19 DIAGNOSIS — I898 Other specified noninfective disorders of lymphatic vessels and lymph nodes: Secondary | ICD-10-CM

## 2018-02-19 MED ORDER — CEFAZOLIN SODIUM-DEXTROSE 2-4 GM/100ML-% IV SOLN
2.0000 g | INTRAVENOUS | Status: AC
  Administered 2018-02-20: 2 g via INTRAVENOUS

## 2018-02-19 NOTE — Progress Notes (Signed)
Surgical Clinic Progress/Follow-up Note   HPI:  27 y.o. Female presents to clinic for requested follow-up evaluation of recurrent Right groin seroma/lymphocoele just under 1 week following needle aspiration of Right groin seroma/lymphocoele and 6 weeks s/p Right inguinal lymphadenectomy x2 (Davis, 01/04/2018) for persistent Right inguinal lymphadenopathy >1 year. Patient states it doesn't hurt her as much as it did previously, but she'd like for it to resolve or be aspirated again. She otherwise denies fever/chills, CP, or SOB.  Review of Systems:  Constitutional: denies fever/chills  problems  Respiratory: denies shortness of breath, wheezing  Cardiovascular: denies chest pain, palpitations  Gastrointestinal: denies abdominal pain, N/V, or diarrhea/and bowel function as per interval history Skin: Denies any other rashes or skin discolorations except as per interval history  Vital Signs:  BP 125/81   Pulse 66   Temp 98 F (36.7 C) (Skin)   Ht 5' 7" (1.702 m)   Wt 145 lb (65.8 kg)   SpO2 98%   BMI 22.71 kg/m    Physical Exam:  Constitutional:  -- Normal body habitus  -- Awake, alert, and oriented x3  Pulmonary:  -- No crackles -- Equal breath sounds bilaterally -- Breathing non-labored at rest Cardiovascular:  -- S1, S2 present  -- No pericardial rubs  Gastrointestinal:  -- Soft and non-distended, non-tender to palpation, no guarding/rebound tenderness -- No abdominal masses appreciated, pulsatile or otherwise  Musculoskeletal / Integumentary:  -- Wounds or skin discoloration: Post-surgical incisions all well-approximated without any peri-incisional erythema or drainage, though clear re-accumulation of moderately large non-tender Right groin lymphocoele -- Extremities: B/L UE and LE FROM, hands and feet warm, no edema   Imaging: No new pertinent imaging available for review  Assessment:  27 y.o. yo Female with a problem list including...  Patient Active Problem List    Diagnosis Date Noted  . Right Inguinal lymphadenopathy 12/27/2017    returns to clinic for recurrent most likely Right groin lymphocoele just under 1 week following needle aspiration of Right groin seroma/lymphocoele and 6 weeks s/p Right inguinal lymphadenectomy x2 (Davis, 01/04/2018) for persistent Right inguinal lymphadenopathy >1 year.  Plan:              - discussed with patient differential diagnoses/etiology             - all risks, benefits, and alternatives to operative drainage of Right inguinal lymphocele and oversewing of any lymphatics were discussed with the patient, all of he questions were answered to her expressed satisfaction, patient expresses she wishes to proceed, and informed consent was obtained.             - will proceed with above planned procedure tomorrow pending anesthesia and OR availability             - anticipate return to clinic 2 weeks following above elective procedure  - instructed to call office if any questions or concerns  All of the above recommendations were discussed with the patient, and all of patient's questions were answered to her expressed satisfaction.  -- Jason E. Davis, MD, RPVI [place]: [facility] - Partnering for exceptional care. Office: [phone] 

## 2018-02-19 NOTE — H&P (View-Only) (Signed)
Surgical Clinic Progress/Follow-up Note   HPI:  28 y.o. Female presents to clinic for requested follow-up evaluation of recurrent Right groin seroma/lymphocoele just under 1 week following needle aspiration of Right groin seroma/lymphocoele and 6 weeks s/p Right inguinal lymphadenectomy x2 Earlene Plater, 01/04/2018) for persistent Right inguinal lymphadenopathy >1 year. Patient states it doesn't hurt her as much as it did previously, but she'd like for it to resolve or be aspirated again. She otherwise denies fever/chills, CP, or SOB.  Review of Systems:  Constitutional: denies fever/chills  problems  Respiratory: denies shortness of breath, wheezing  Cardiovascular: denies chest pain, palpitations  Gastrointestinal: denies abdominal pain, N/V, or diarrhea/and bowel function as per interval history Skin: Denies any other rashes or skin discolorations except as per interval history  Vital Signs:  BP 125/81   Pulse 66   Temp 98 F (36.7 C) (Skin)   Ht 5\' 7"  (1.702 m)   Wt 145 lb (65.8 kg)   SpO2 98%   BMI 22.71 kg/m    Physical Exam:  Constitutional:  -- Normal body habitus  -- Awake, alert, and oriented x3  Pulmonary:  -- No crackles -- Equal breath sounds bilaterally -- Breathing non-labored at rest Cardiovascular:  -- S1, S2 present  -- No pericardial rubs  Gastrointestinal:  -- Soft and non-distended, non-tender to palpation, no guarding/rebound tenderness -- No abdominal masses appreciated, pulsatile or otherwise  Musculoskeletal / Integumentary:  -- Wounds or skin discoloration: Post-surgical incisions all well-approximated without any peri-incisional erythema or drainage, though clear re-accumulation of moderately large non-tender Right groin lymphocoele -- Extremities: B/L UE and LE FROM, hands and feet warm, no edema   Imaging: No new pertinent imaging available for review  Assessment:  28 y.o. yo Female with a problem list including...  Patient Active Problem List    Diagnosis Date Noted  . Right Inguinal lymphadenopathy 12/27/2017    returns to clinic for recurrent most likely Right groin lymphocoele just under 1 week following needle aspiration of Right groin seroma/lymphocoele and 6 weeks s/p Right inguinal lymphadenectomy x2 Earlene Plater, 01/04/2018) for persistent Right inguinal lymphadenopathy >1 year.  Plan:              - discussed with patient differential diagnoses/etiology             - all risks, benefits, and alternatives to operative drainage of Right inguinal lymphocele and oversewing of any lymphatics were discussed with the patient, all of he questions were answered to her expressed satisfaction, patient expresses she wishes to proceed, and informed consent was obtained.             - will proceed with above planned procedure tomorrow pending anesthesia and OR availability             - anticipate return to clinic 2 weeks following above elective procedure  - instructed to call office if any questions or concerns  All of the above recommendations were discussed with the patient, and all of patient's questions were answered to her expressed satisfaction.  -- Scherrie Gerlach Earlene Plater, MD, RPVI [place]: [facility] - Partnering for exceptional care. Office: [phone]

## 2018-02-19 NOTE — Patient Instructions (Addendum)
Patient to have a drainage of the right inguinal area tomorrow at [facility]. You will pre admit at the hospital that morning. Patient aware to have nothing to eat or drink after midnight tonight.

## 2018-02-20 ENCOUNTER — Ambulatory Visit
Admission: RE | Admit: 2018-02-20 | Discharge: 2018-02-20 | Disposition: A | Discharge status: Home / Self Care | Payer: Medicaid Other | Attending: Surgery | Admitting: Surgery

## 2018-02-20 ENCOUNTER — Encounter
Admission: RE | Disposition: A | Discharge status: Home / Self Care | Payer: Self-pay | Source: Home / Self Care | Attending: Surgery

## 2018-02-20 ENCOUNTER — Encounter: Payer: Self-pay | Admitting: Emergency Medicine

## 2018-02-20 ENCOUNTER — Ambulatory Visit: Payer: Medicaid Other | Admitting: Certified Registered Nurse Anesthetist

## 2018-02-20 DIAGNOSIS — L7634 Postprocedural seroma of skin and subcutaneous tissue following other procedure: Secondary | ICD-10-CM | POA: Insufficient documentation

## 2018-02-20 DIAGNOSIS — I898 Other specified noninfective disorders of lymphatic vessels and lymph nodes: Secondary | ICD-10-CM | POA: Diagnosis present

## 2018-02-20 DIAGNOSIS — Z793 Long term (current) use of hormonal contraceptives: Secondary | ICD-10-CM | POA: Insufficient documentation

## 2018-02-20 HISTORY — PX: INCISION AND DRAINAGE ABSCESS: SHX5864

## 2018-02-20 LAB — POCT PREGNANCY, URINE: Preg Test, Ur: NEGATIVE

## 2018-02-20 SURGERY — INCISION AND DRAINAGE, ABSCESS
Anesthesia: General | Laterality: Right

## 2018-02-20 MED ORDER — ACETAMINOPHEN 500 MG PO TABS
1000.0000 mg | ORAL_TABLET | ORAL | Status: AC
  Administered 2018-02-20: 1000 mg via ORAL

## 2018-02-20 MED ORDER — MIDAZOLAM HCL 2 MG/2ML IJ SOLN
INTRAMUSCULAR | Status: AC
  Filled 2018-02-20: qty 2

## 2018-02-20 MED ORDER — HYDROMORPHONE HCL 1 MG/ML IJ SOLN
INTRAMUSCULAR | Status: AC
  Administered 2018-02-20: 0.25 mg via INTRAVENOUS
  Filled 2018-02-20: qty 1

## 2018-02-20 MED ORDER — MIDAZOLAM HCL 2 MG/2ML IJ SOLN
INTRAMUSCULAR | Status: DC | PRN
  Administered 2018-02-20: 2 mg via INTRAVENOUS

## 2018-02-20 MED ORDER — FENTANYL CITRATE (PF) 100 MCG/2ML IJ SOLN
INTRAMUSCULAR | Status: AC
  Filled 2018-02-20: qty 2

## 2018-02-20 MED ORDER — LACTATED RINGERS IV SOLN
INTRAVENOUS | Status: DC
  Administered 2018-02-20 (×2): via INTRAVENOUS

## 2018-02-20 MED ORDER — ONDANSETRON HCL 4 MG/2ML IJ SOLN
INTRAMUSCULAR | Status: DC | PRN
  Administered 2018-02-20: 4 mg via INTRAVENOUS

## 2018-02-20 MED ORDER — LIDOCAINE HCL 1 % IJ SOLN
INTRAMUSCULAR | Status: DC | PRN
  Administered 2018-02-20: 10 mL

## 2018-02-20 MED ORDER — CEFAZOLIN SODIUM-DEXTROSE 2-4 GM/100ML-% IV SOLN
INTRAVENOUS | Status: AC
  Filled 2018-02-20: qty 100

## 2018-02-20 MED ORDER — PROPOFOL 10 MG/ML IV BOLUS
INTRAVENOUS | Status: DC | PRN
  Administered 2018-02-20: 60 mg via INTRAVENOUS
  Administered 2018-02-20: 140 mg via INTRAVENOUS

## 2018-02-20 MED ORDER — DEXAMETHASONE SODIUM PHOSPHATE 10 MG/ML IJ SOLN
INTRAMUSCULAR | Status: DC | PRN
  Administered 2018-02-20: 4 mg via INTRAVENOUS

## 2018-02-20 MED ORDER — CHLORHEXIDINE GLUCONATE CLOTH 2 % EX PADS: 6.0000 | MEDICATED_PAD | Freq: Once | CUTANEOUS | Status: DC

## 2018-02-20 MED ORDER — LIDOCAINE HCL (CARDIAC) PF 100 MG/5ML IV SOSY
PREFILLED_SYRINGE | INTRAVENOUS | Status: DC | PRN
  Administered 2018-02-20: 60 mg via INTRAVENOUS

## 2018-02-20 MED ORDER — PROPOFOL 10 MG/ML IV BOLUS
INTRAVENOUS | Status: AC
  Filled 2018-02-20: qty 20

## 2018-02-20 MED ORDER — DEXAMETHASONE SODIUM PHOSPHATE 10 MG/ML IJ SOLN
INTRAMUSCULAR | Status: AC
  Filled 2018-02-20: qty 1

## 2018-02-20 MED ORDER — ACETAMINOPHEN 500 MG PO TABS
ORAL_TABLET | ORAL | Status: AC
  Administered 2018-02-20: 1000 mg via ORAL
  Filled 2018-02-20: qty 2

## 2018-02-20 MED ORDER — EPHEDRINE SULFATE 50 MG/ML IJ SOLN
INTRAMUSCULAR | Status: AC
  Filled 2018-02-20: qty 1

## 2018-02-20 MED ORDER — FENTANYL CITRATE (PF) 100 MCG/2ML IJ SOLN
INTRAMUSCULAR | Status: DC | PRN
  Administered 2018-02-20 (×2): 25 ug via INTRAVENOUS

## 2018-02-20 MED ORDER — OXYCODONE-ACETAMINOPHEN 5-325 MG PO TABS: 1.0000 | ORAL_TABLET | ORAL | 0 refills | Status: DC | PRN

## 2018-02-20 MED ORDER — LIDOCAINE HCL (PF) 2 % IJ SOLN
INTRAMUSCULAR | Status: AC
  Filled 2018-02-20: qty 10

## 2018-02-20 MED ORDER — LIDOCAINE HCL (PF) 1 % IJ SOLN
INTRAMUSCULAR | Status: AC
  Filled 2018-02-20: qty 30

## 2018-02-20 MED ORDER — BUPIVACAINE HCL (PF) 0.5 % IJ SOLN
INTRAMUSCULAR | Status: AC
  Filled 2018-02-20: qty 30

## 2018-02-20 MED ORDER — ONDANSETRON HCL 4 MG/2ML IJ SOLN
INTRAMUSCULAR | Status: AC
  Filled 2018-02-20: qty 2

## 2018-02-20 MED ORDER — HYDROMORPHONE HCL 1 MG/ML IJ SOLN
0.2500 mg | INTRAMUSCULAR | Status: DC | PRN
  Administered 2018-02-20 (×2): 0.25 mg via INTRAVENOUS

## 2018-02-20 MED ORDER — PROMETHAZINE HCL 25 MG/ML IJ SOLN: 6.2500 mg | INTRAMUSCULAR | Status: DC | PRN

## 2018-02-20 MED ORDER — BUPIVACAINE HCL (PF) 0.5 % IJ SOLN
INTRAMUSCULAR | Status: DC | PRN
  Administered 2018-02-20: 10 mL

## 2018-02-20 MED ORDER — ROCURONIUM BROMIDE 50 MG/5ML IV SOLN
INTRAVENOUS | Status: AC
  Filled 2018-02-20: qty 1

## 2018-02-20 SURGICAL SUPPLY — 34 items
BLADE SURG 11 STRL SS SAFETY (MISCELLANEOUS) ×3 IMPLANT
BRIEF STRETCH MATERNITY 2XLG (MISCELLANEOUS) ×3 IMPLANT
COVER WAND RF STERILE (DRAPES) ×3 IMPLANT
DERMABOND ADVANCED (GAUZE/BANDAGES/DRESSINGS) ×2
DERMABOND ADVANCED .7 DNX12 (GAUZE/BANDAGES/DRESSINGS) IMPLANT
DRAIN PENROSE 5/8X12 LTX STRL (DRAIN) ×3 IMPLANT
DRAPE LAPAROTOMY 100X77 ABD (DRAPES) ×3 IMPLANT
DRAPE UTILITY 15X26 TOWEL STRL (DRAPES) ×3 IMPLANT
ELECT CAUTERY BLADE 6.4 (BLADE) ×3 IMPLANT
ELECT REM PT RETURN 9FT ADLT (ELECTROSURGICAL) ×3
ELECTRODE REM PT RTRN 9FT ADLT (ELECTROSURGICAL) ×1 IMPLANT
GAUZE 4X4 16PLY RFD (DISPOSABLE) ×3 IMPLANT
GAUZE PACKING IODOFORM 1/2 (PACKING) ×3 IMPLANT
GAUZE SPONGE 4X4 12PLY STRL (GAUZE/BANDAGES/DRESSINGS) ×3 IMPLANT
GLOVE BIO SURGEON STRL SZ7 (GLOVE) ×3 IMPLANT
GLOVE BIOGEL PI IND STRL 7.5 (GLOVE) ×1 IMPLANT
GLOVE BIOGEL PI INDICATOR 7.5 (GLOVE) ×2
GOWN STRL REUS W/ TWL LRG LVL3 (GOWN DISPOSABLE) ×2 IMPLANT
GOWN STRL REUS W/TWL LRG LVL3 (GOWN DISPOSABLE) ×4
KIT TURNOVER KIT A (KITS) ×3 IMPLANT
NEEDLE HYPO 22GX1.5 SAFETY (NEEDLE) ×3 IMPLANT
NS IRRIG 500ML POUR BTL (IV SOLUTION) ×3 IMPLANT
PACK BASIN MINOR ARMC (MISCELLANEOUS) ×3 IMPLANT
PAD ABD DERMACEA PRESS 5X9 (GAUZE/BANDAGES/DRESSINGS) ×6 IMPLANT
SUT MNCRL 4-0 (SUTURE) ×2
SUT MNCRL 4-0 27XMFL (SUTURE) ×1
SUT SILK 3-0 (SUTURE) ×2 IMPLANT
SUT VIC AB 3-0 SH 27 (SUTURE) ×2
SUT VIC AB 3-0 SH 27X BRD (SUTURE) IMPLANT
SUTURE MNCRL 4-0 27XMF (SUTURE) IMPLANT
SWAB DUAL CULTURE TRANS RED ST (MISCELLANEOUS) ×3 IMPLANT
SYR 10ML LL (SYRINGE) ×3 IMPLANT
SYR 20CC LL (SYRINGE) ×3 IMPLANT
SYR BULB 3OZ (MISCELLANEOUS) ×1 IMPLANT

## 2018-02-20 NOTE — Anesthesia Post-op Follow-up Note (Signed)
Anesthesia QCDR form completed.        

## 2018-02-20 NOTE — Op Note (Signed)
SURGICAL OPERATIVE REPORT  DATE OF PROCEDURE: 02/20/2018  ATTENDING Surgeon(s): Ancil Linsey, MD  ANESTHESIA: GETA (LMA)  PRE-OPERATIVE DIAGNOSIS: Recurrent Right inguinal lymphocele vs seroma (icd-10's: I89.8, T81.89XD)  POST-OPERATIVE DIAGNOSIS: Recurrent post-procedural Right inguinal seroma (icd-10's: L76.34)  PROCEDURE(S):  1.) Operative drainage of recurrent Right groin seroma (cpt: 10140) 2.) Fulguration of wound base and re-approximation of tissues in multiple layers  INTRAOPERATIVE FINDINGS: Recurrent Right inguinal seroma without any identifiable lymphatic drainage   INTRAVENOUS FLUIDS: 1000 mL crystalloid   ESTIMATED BLOOD LOSS: Minimal (<10 mL)  URINE OUTPUT: No Foley   SPECIMENS: No Specimen  IMPLANTS: None  DRAINS: none  COMPLICATIONS: None apparent  CONDITION AT END OF PROCEDURE: Hemodynamically stable and extubated  DISPOSITION OF PATIENT: PACU  INDICATIONS FOR PROCEDURE:  Patient is a 27 y.o. female who recently underwent excisional biopsy of persistent Right inguinal lymphadenopathy, after which she developed a Right inguinal lymphocele vs seroma, which was aspirated successfully, but recurred within 1 week. Accordingly, due to concern for lymphocele, both repeat aspiration and operative drainage with ligation/oversewing were offered, and patient elected to proceed with operative drainage. All risks, benefits, and alternatives to above procedures were discussed with the patient, all of patient's questions were answered to her expressed satisfaction, and informed consent was obtained and documented.  DETAILS OF PROCEDURE: Patient was brought to the operating suite and appropriately identified. General anesthesia was administered along with appropriate pre-operative antibiotics, and LMA intubation was performed by anesthetist. In supine position, operative site was prepped and draped in the usual sterile fashion, and following a brief time out, local  anesthetic was injected subcutaneously and patient's former post-surgical scar was excised. Clear yellow-colored fluid was promptly aspirated, and wound was thoroughly evaluated with no evidence of actively draining lymphatic/lymphocele. Accordingly, fulguration of the wound base was performed using electrocautery, and cavity tissues were approximated in several layers using primarily 3-0 silk sutures, after which dermis was re-approximated using buried interrupted 3-0 Vicryl suture, and epidermis was re-approximated using 4-0 Monocryl suture. Patient's skin was then cleaned and dried, after which Dermabond skin glue was applied and allowed to dry.  Patient was then safely able to be extubated, awakened, and transferred to PACU for post-operative monitoring and care.  I was present for all aspects of the above procedure, and no operative complications were apparent.

## 2018-02-20 NOTE — Anesthesia Procedure Notes (Signed)
Procedure Name: LMA Insertion Date/Time: 02/20/2018 8:58 AM Performed by: Milagros Reap, CRNA Pre-anesthesia Checklist: Patient identified, Emergency Drugs available, Suction available, Patient being monitored and Timeout performed Patient Re-evaluated:Patient Re-evaluated prior to induction Oxygen Delivery Method: Circle system utilized Preoxygenation: Pre-oxygenation with 100% oxygen Induction Type: IV induction Ventilation: Mask ventilation without difficulty LMA: LMA inserted LMA Size: 3.0 Number of attempts: 1 Placement Confirmation: positive ETCO2 and breath sounds checked- equal and bilateral Tube secured with: Tape Dental Injury: Teeth and Oropharynx as per pre-operative assessment

## 2018-02-20 NOTE — Interval H&P Note (Signed)
History and Physical Interval Note:  02/20/2018 8:37 AM  Jamie Weaver  has presented today for surgery, with the diagnosis of RIGHT GROIN/INGUINAL LYMPHOCOELE  The various methods of treatment have been discussed with the patient and family. After consideration of risks, benefits and other options for treatment, the patient has consented to  Procedure(s): DRAINAGE AND OVERSEWING OF RIGHT INGUINAL LYMPHOCOELE (Right) as a surgical intervention .  The patient's history has been reviewed, patient examined, no change in status, stable for surgery.  I have reviewed the patient's chart and labs.  Questions were answered to the patient's satisfaction.     Ancil Linsey

## 2018-02-20 NOTE — Anesthesia Procedure Notes (Signed)
Performed by: Shawntina Diffee H, CRNA       

## 2018-02-20 NOTE — Discharge Instructions (Addendum)
In addition to included general post-operative instructions for Drainage of Right inguinal seroma vs lymphocele,  Diet: Resume home heart healthy diet.   Activity: No heavy lifting >15 - 20 pounds (children, pets, laundry, garbage) or strenuous activity x 2 weeks (until follow-up), but light activity and walking are encouraged. Do not drive or drink alcohol if taking narcotic pain medications.  Wound care: 2 days after surgery (Friday, 1/10), you may shower/get incision wet with soapy water and pat dry (do not rub incisions), but no baths or submerging incision underwater until follow-up.   Medications: Resume all home medications. For mild to moderate pain: acetaminophen (Tylenol) or ibuprofen/naproxen (if no kidney disease). Combining Tylenol with alcohol can substantially increase your risk of causing liver disease. Narcotic pain medications, if prescribed, can be used for severe pain, though may cause nausea, constipation, and drowsiness. Do not combine Tylenol and Percocet (or similar) within a 6 hour period as Percocet (and similar) contain(s) Tylenol. If you do not need the narcotic pain medication, you do not need to fill the prescription.  Call office [phone]) at any time if any questions, worsening pain, fevers/chills, bleeding, drainage from incision site, or other concerns.   AMBULATORY SURGERY  DISCHARGE INSTRUCTIONS   1) The drugs that you were given will stay in your system until tomorrow so for the next 24 hours you should not:  A) Drive an automobile B) Make any legal decisions C) Drink any alcoholic beverage   2) You may resume regular meals tomorrow.  Today it is better to start with liquids and gradually work up to solid foods.  You may eat anything you prefer, but it is better to start with liquids, then soup and crackers, and gradually work up to solid foods.   3) Please notify your doctor immediately if you have any unusual bleeding, trouble breathing,  redness and pain at the surgery site, drainage, fever, or pain not relieved by medication.    4) Additional Instructions:        Please contact your physician with any problems or Same Day Surgery at [phone], Monday through Friday 6 am to 4 pm, or [place] at [facility] number at [phone].

## 2018-02-20 NOTE — Anesthesia Preprocedure Evaluation (Addendum)
Anesthesia Evaluation  Patient identified by MRN, date of birth, ID band Patient awake    Reviewed: Allergy & Precautions, H&P , NPO status , Patient's Chart, lab work & pertinent test results  Airway Mallampati: II       Dental  (+) Teeth Intact   Pulmonary neg pulmonary ROS,           Cardiovascular negative cardio ROS       Neuro/Psych negative neurological ROS  negative psych ROS   GI/Hepatic negative GI ROS, Neg liver ROS,   Endo/Other  negative endocrine ROS  Renal/GU      Musculoskeletal   Abdominal   Peds  Hematology negative hematology ROS (+)   Anesthesia Other Findings History reviewed. No pertinent past medical history.  Past Surgical History: 02/01/2017: ANKLE ARTHROSCOPY; Left     Comment:  Procedure: ANKLE ARTHROSCOPY WITH DEBRIDMENT;  Surgeon:               Christena FlakePoggi, John J, MD;  Location: [facility] ORS;  Service:               Orthopedics;  Laterality: Left; 01/04/2018: INGUINAL LYMPH NODE BIOPSY; Right     Comment:  Procedure: INGUINAL LYMPH NODE BIOPSY;  Surgeon: Ancil Linseyavis,               Jason Evan, MD;  Location: [facility] ORS;  Service: General;                Laterality: Right;  BMI    Body Mass Index:  22.71 kg/m      Reproductive/Obstetrics negative OB ROS                            Anesthesia Physical Anesthesia Plan  ASA: I  Anesthesia Plan: General LMA   Post-op Pain Management:    Induction:   PONV Risk Score and Plan: Dexamethasone, Ondansetron, Midazolam and Treatment may vary due to age or medical condition  Airway Management Planned:   Additional Equipment:   Intra-op Plan:   Post-operative Plan:   Informed Consent: I have reviewed the patients History and Physical, chart, labs and discussed the procedure including the risks, benefits and alternatives for the proposed anesthesia with the patient or authorized representative who has indicated his/her  understanding and acceptance.   Dental Advisory Given  Plan Discussed with: Anesthesiologist and CRNA  Anesthesia Plan Comments:        Anesthesia Quick Evaluation

## 2018-02-20 NOTE — Transfer of Care (Signed)
Immediate Anesthesia Transfer of Care Note  Patient: Jamie Weaver  Procedure(s) Performed: DRAINAGE AND OVERSEWING OF RIGHT INGUINAL LYMPHOCOELE (Right )  Patient Location: PACU  Anesthesia Type:General  Level of Consciousness: awake, alert  and oriented  Airway & Oxygen Therapy: Patient Spontanous Breathing and Patient connected to face mask oxygen  Post-op Assessment: Report given to RN and Post -op Vital signs reviewed and stable  Post vital signs: Reviewed and stable  Last Vitals:  Vitals Value Taken Time  BP 104/68 02/20/2018 10:10 AM  Temp 36.7 C 02/20/2018 10:10 AM  Pulse 50 02/20/2018 10:12 AM  Resp 13 02/20/2018 10:12 AM  SpO2 100 % 02/20/2018 10:12 AM  Vitals shown include unvalidated device data.  Last Pain:  Vitals:   02/20/18 0707  PainSc: 0-No pain         Complications: No apparent anesthesia complications

## 2018-02-20 NOTE — Anesthesia Postprocedure Evaluation (Signed)
Anesthesia Post Note  Patient: Jamie Weaver  Procedure(s) Performed: DRAINAGE AND OVERSEWING OF RIGHT INGUINAL LYMPHOCOELE (Right )  Patient location during evaluation: PACU Anesthesia Type: General Level of consciousness: awake and alert Pain management: pain level controlled Vital Signs Assessment: post-procedure vital signs reviewed and stable Respiratory status: spontaneous breathing, nonlabored ventilation and respiratory function stable Cardiovascular status: blood pressure returned to baseline and stable Postop Assessment: no apparent nausea or vomiting Anesthetic complications: no     Last Vitals:  Vitals:   02/20/18 1104 02/20/18 1120  BP: 118/63 111/68  Pulse: (!) 50   Resp: 14   Temp: 36.7 C   SpO2: 100%     Last Pain:  Vitals:   02/20/18 1104  PainSc: 1                  Jovita Gamma

## 2018-02-20 NOTE — OR Nursing (Signed)
Discharge instructions discussed with patient. Patient voices understanding.

## 2018-02-21 ENCOUNTER — Encounter: Payer: Self-pay | Admitting: Surgery

## 2018-02-26 DIAGNOSIS — I898 Other specified noninfective disorders of lymphatic vessels and lymph nodes: Secondary | ICD-10-CM

## 2018-03-04 ENCOUNTER — Encounter: Payer: Self-pay | Admitting: Surgery

## 2018-03-05 ENCOUNTER — Other Ambulatory Visit: Payer: Self-pay

## 2018-03-05 ENCOUNTER — Ambulatory Visit (INDEPENDENT_AMBULATORY_CARE_PROVIDER_SITE_OTHER): Payer: Medicaid Other | Admitting: Surgery

## 2018-03-05 ENCOUNTER — Encounter: Payer: Self-pay | Admitting: Surgery

## 2018-03-05 VITALS — BP 112/71 | HR 50 | Temp 97.5°F | Ht 67.0 in | Wt 147.0 lb

## 2018-03-05 DIAGNOSIS — Z4889 Encounter for other specified surgical aftercare: Secondary | ICD-10-CM | POA: Diagnosis not present

## 2018-03-05 DIAGNOSIS — R590 Localized enlarged lymph nodes: Secondary | ICD-10-CM

## 2018-03-05 DIAGNOSIS — I898 Other specified noninfective disorders of lymphatic vessels and lymph nodes: Secondary | ICD-10-CM | POA: Diagnosis not present

## 2018-03-05 NOTE — Progress Notes (Signed)
Surgical Clinic Progress/Follow-up Note   HPI:  28 y.o. Female presents to clinic for post-op follow-up 13 Days s/p operative drainage and wound closure in layers of recurrent Right groin lymphocele vs seroma Earlene Plater, 02/20/2018). Patient reports complete resolution of pre-operative discomfort, stating that she never had much pain following this surgery (and specifically less pain than her prior recent surgery) and denies any redness around or drainage from her wound without swelling, N/V, fever/chills, CP, or SOB.  Review of Systems:  Constitutional: denies fever/chills  Respiratory: denies shortness of breath, wheezing  Cardiovascular: denies chest pain, palpitations  Gastrointestinal: abdominal pain, N/V, and bowel function as per interval history Skin: Denies any other rashes or skin discolorations except post-surgical wounds as per interval history  Vital Signs:  BP 112/71   Pulse (!) 50   Temp (!) 97.5 F (36.4 C) (Skin)   Ht 5\' 7"  (1.702 m)   Wt 147 lb (66.7 kg)   SpO2 98%   BMI 23.02 kg/m    Physical Exam:  Constitutional:  -- Normal body habitus  -- Awake, alert, and oriented x3  Pulmonary:  -- No crackles -- Equal breath sounds bilaterally -- Breathing non-labored at rest Cardiovascular:  -- S1, S2 present  -- No pericardial rubs  Musculoskeletal / Integumentary:  -- Wounds or skin discoloration: Post-surgical Right groin incision well-approximated without any peri-incisional swelling, erythema, or drainage -- Extremities: B/L UE and LE FROM, hands and feet warm, no edema   Imaging: No new pertinent imaging available for review  Assessment:  28 y.o. yo Female with a problem list including...  Patient Active Problem List   Diagnosis Date Noted  . Lymphocoele   . Right Inguinal lymphadenopathy 12/27/2017    presents to clinic for post-op follow-up evaluation, doing very well 13 Days s/p operative drainage and wound closure in layers of recurrent Right groin  lymphocele vs seroma Earlene Plater, 02/20/2018).  Plan:              - okay to submerge incisions under water (baths, swimming) prn             - gradually resume all activities without restrictions over next 2 weeks             - apply sunblock particularly to incisions with sun exposure to reduce pigmentation of scars             - return to clinic as needed, instructed to call office if any questions or concerns  All of the above recommendations were discussed with the patient, and all of patient's questions were answered to her expressed satisfaction.  -- Scherrie Gerlach Earlene Plater, MD, RPVI [place]: [facility] General [facility] - Partnering for exceptional care. Office: [phone]

## 2018-03-05 NOTE — Patient Instructions (Signed)
Return as needed.The patient is aware to call back for any questions or concerns.  

## 2018-08-14 DIAGNOSIS — Z30432 Encounter for removal of intrauterine contraceptive device: Secondary | ICD-10-CM | POA: Diagnosis not present

## 2018-08-14 DIAGNOSIS — Z113 Encounter for screening for infections with a predominantly sexual mode of transmission: Secondary | ICD-10-CM | POA: Diagnosis not present

## 2018-08-14 DIAGNOSIS — T8332XA Displacement of intrauterine contraceptive device, initial encounter: Secondary | ICD-10-CM | POA: Diagnosis not present

## 2018-08-14 DIAGNOSIS — Z538 Procedure and treatment not carried out for other reasons: Secondary | ICD-10-CM | POA: Diagnosis not present

## 2018-08-22 DIAGNOSIS — Z30430 Encounter for insertion of intrauterine contraceptive device: Secondary | ICD-10-CM | POA: Diagnosis not present

## 2018-08-22 DIAGNOSIS — Z30432 Encounter for removal of intrauterine contraceptive device: Secondary | ICD-10-CM | POA: Diagnosis not present

## 2018-08-22 DIAGNOSIS — T8332XD Displacement of intrauterine contraceptive device, subsequent encounter: Secondary | ICD-10-CM | POA: Diagnosis not present

## 2019-04-18 ENCOUNTER — Other Ambulatory Visit: Payer: Self-pay

## 2019-04-18 ENCOUNTER — Encounter: Payer: Self-pay | Admitting: Family Medicine

## 2019-04-18 ENCOUNTER — Ambulatory Visit (INDEPENDENT_AMBULATORY_CARE_PROVIDER_SITE_OTHER): Payer: 59 | Admitting: Family Medicine

## 2019-04-18 VITALS — BP 102/60 | HR 86 | Temp 98.9°F | Ht 67.0 in | Wt 151.8 lb

## 2019-04-18 DIAGNOSIS — Z7689 Persons encountering health services in other specified circumstances: Secondary | ICD-10-CM

## 2019-04-18 DIAGNOSIS — L700 Acne vulgaris: Secondary | ICD-10-CM

## 2019-04-18 DIAGNOSIS — F419 Anxiety disorder, unspecified: Secondary | ICD-10-CM | POA: Diagnosis not present

## 2019-04-18 MED ORDER — SPIRONOLACTONE 50 MG PO TABS: 50.0000 mg | ORAL_TABLET | Freq: Every day | ORAL | 1 refills | Status: DC
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
## 2019-04-18 NOTE — Patient Instructions (Signed)
Difren gel- try to use a couple of times a week  Let me know if no improvement in 3 months   Acne  Acne is a skin problem that causes pimples and other skin changes. The skin has many tiny openings called pores. Each pore contains an oil gland. Oil glands make an oily substance that is called sebum. Acne occurs when the pores in the skin get blocked. The pores may become infected with bacteria, or they may become red, sore, and swollen. Acne is a common skin problem, especially for teenagers. It often occurs on the face, neck, chest, upper arms, and back. Acne usually goes away over time. What are the causes? Acne is caused when oil glands get blocked with sebum, dead skin cells, and dirt. The bacteria that are normally found in the oil glands then multiply and cause inflammation. Acne is commonly triggered by changes in your hormones. These hormonal changes can cause the oil glands to get bigger and to make more sebum. Factors that can make acne worse include:  Hormone changes during: ? Adolescence. ? Women's menstrual cycles. ? Pregnancy.  Oil-based cosmetics and hair products.  Stress.  Hormone problems that are caused by certain diseases.  Certain medicines.  Pressure from headbands, backpacks, or shoulder pads.  Exposure to certain oils and chemicals.  Eating a diet high in carbohydrates that quickly turn to sugar. These include dairy products, desserts, and chocolates. What increases the risk? This condition is more likely to develop in:  Teenagers.  People who have a family history of acne. What are the signs or symptoms? Symptoms include:  Small, red bumps (pimples or papules).  Whiteheads.  Blackheads.  Small, pus-filled pimples (pustules).  Big, red pimples or pustules that feel tender. More severe acne can cause:  An abscess. This is an infected area that contains a collection of pus.  Cysts. These are hard, painful, fluid-filled sacs.  Scars. These  can happen after large pimples heal. How is this diagnosed? This condition is diagnosed with a medical history and physical exam. Blood tests may also be done. How is this treated? Treatment for this condition can vary depending on the severity of your acne. Treatment may include:  Creams and lotions that prevent oil glands from clogging.  Creams and lotions that treat or prevent infections and inflammation.  Antibiotic medicines that are applied to the skin or taken as a pill.  Pills that decrease sebum production.  Birth control pills.  Light or laser treatments.  Injections of medicine into the affected areas.  Chemicals that cause peeling of the skin.  Surgery. Your health care provider will also recommend the best way to take care of your skin. Good skin care is the most important part of treatment. Follow these instructions at home: Skin care Take care of your skin as told by your health care provider. You may be told to do these things:  Wash your skin gently at least two times each day, as well as: ? After you exercise. ? Before you go to bed.  Use mild soap.  Apply a water-based skin moisturizer after you wash your skin.  Use a sunscreen or sunblock with SPF 30 or greater. This is especially important if you are using acne medicines.  Choose cosmetics that will not block your oil glands (are noncomedogenic). Medicines  Take over-the-counter and prescription medicines only as told by your health care provider.  If you were prescribed an antibiotic medicine, apply it or take it  as told by your health care provider. Do not stop using the antibiotic even if your condition improves. General instructions  Keep your hair clean and off your face. If you have oily hair, shampoo your hair regularly or daily.  Avoid wearing tight headbands or hats.  Avoid picking or squeezing your pimples. That can make your acne worse and cause scarring.  Shave gently and only when  necessary.  Keep a food journal to figure out if any foods are linked to your acne. Avoid dairy products, desserts, and chocolates.  Take steps to manage and reduce stress.  Keep all follow-up visits as told by your health care provider. This is important. Contact a health care provider if:  Your acne is not better after eight weeks.  Your acne gets worse.  You have a large area of skin that is red or tender.  You think that you are having side effects from any acne medicine. Summary  Acne is a skin problem that causes pimples and other skin changes. Acne is a common skin problem, especially for teenagers. Acne usually goes away over time.  Acne is commonly triggered by changes in your hormones. There are many other causes, such as stress, diet, and certain medicines.  Follow your health care provider's instructions for how to take care of your skin. Good skin care is the most important part of treatment.  Take over-the-counter and prescription medicines only as told by your health care provider.  Contact your health care provider if you think that you are having side effects from any acne medicine. This information is not intended to replace advice given to you by your health care provider. Make sure you discuss any questions you have with your health care provider. Document Revised: 06/12/2017 Document Reviewed: 06/12/2017 Elsevier Patient Education  2020 Elsevier Inc. Spironolactone Oral Tablets What is this medicine? SPIRONOLACTONE (speer on oh LAK tone) is a diuretic. It helps you make more urine and to lose excess water from your body. This medicine is used to treat high blood pressure, and edema or swelling from heart, kidney, or liver disease. It is also used to treat patients who make too much aldosterone or have low potassium. This medicine may be used for other purposes; ask your health care provider or pharmacist if you have questions. COMMON BRAND NAME(S):  Aldactone What should I tell my health care provider before I take this medicine? They need to know if you have any of these conditions:  high blood level of potassium  kidney disease or trouble making urine  liver disease  an unusual or allergic reaction to spironolactone, other medicines, foods, dyes, or preservatives  pregnant or trying to get pregnant  breast-feeding How should I use this medicine? Take this drug by mouth. Take it as directed on the prescription label at the same time every day. You can take it with or without food. You should always take it the same way. Keep taking it unless your health care provider tells you to stop. Talk to your health care provider about the use of this drug in children. Special care may be needed. Overdosage: If you think you have taken too much of this medicine contact a poison control center or emergency room at once. NOTE: This medicine is only for you. Do not share this medicine with others. What if I miss a dose? If you miss a dose, take it as soon as you can. If it is almost time for your next dose,  take only that dose. Do not take double or extra doses. What may interact with this medicine? Do not take this medicine with any of the following medications:  cidofovir  eplerenone  tranylcypromine This medicine may also interact with the following medications:  aspirin  certain medicines for blood pressure or heart disease like benazepril, lisinopril, losartan, valsartan  certain medicines that treat or prevent blood clots like heparin and enoxaparin  cholestyramine  cyclosporine  digoxin  lithium  medicines that relax muscles for surgery  NSAIDs, medicines for pain and inflammation, like ibuprofen or naproxen  other diuretics  potassium supplements  steroid medicines like prednisone or cortisone  trimethoprim This list may not describe all possible interactions. Give your health care provider a list of all the  medicines, herbs, non-prescription drugs, or dietary supplements you use. Also tell them if you smoke, drink alcohol, or use illegal drugs. Some items may interact with your medicine. What should I watch for while using this medicine? Visit your doctor or health care professional for regular checks on your progress. Check your blood pressure as directed. Ask your doctor what your blood pressure should be, and when you should contact them. You may need to be on a special diet while taking this medicine. Ask your doctor. Also, ask how many glasses of fluid you need to drink a day. You must not get dehydrated. This medicine may make you feel confused, dizzy or lightheaded. Drinking alcohol and taking some medicines can make this worse. Do not drive, use machinery, or do anything that needs mental alertness until you know how this medicine affects you. Do not sit or stand up quickly. What side effects may I notice from receiving this medicine? Side effects that you should report to your doctor or health care professional as soon as possible:  allergic reactions such as skin rash or itching, hives, swelling of the lips, mouth, tongue, or throat  black or tarry stools  fast, irregular heartbeat  fever  muscle pain, cramps  numbness, tingling in hands or feet  trouble breathing  trouble passing urine  unusual bleeding  unusually weak or tired Side effects that usually do not require medical attention (report to your doctor or health care professional if they continue or are bothersome):  change in voice or hair growth  confusion  dizzy, drowsy  dry mouth, increased thirst  enlarged or tender breasts  headache  irregular menstrual periods  sexual difficulty, unable to have an erection  stomach upset This list may not describe all possible side effects. Call your doctor for medical advice about side effects. You may report side effects to FDA at 1-800-FDA-1088. Where should I  keep my medicine? Keep out of the reach of children and pets. Store at room temperature between 20 and 25 degrees C (68 and 77 degrees F). Throw away any unused drug after the expiration date. NOTE: This sheet is a summary. It may not cover all possible information. If you have questions about this medicine, talk to your doctor, pharmacist, or health care provider.  2020 Elsevier/Gold Standard (2018-09-24 12:38:34)

## 2019-04-21 ENCOUNTER — Encounter: Payer: Self-pay | Admitting: Family Medicine

## 2019-05-06 ENCOUNTER — Other Ambulatory Visit (INDEPENDENT_AMBULATORY_CARE_PROVIDER_SITE_OTHER): Payer: 59

## 2019-05-06 ENCOUNTER — Other Ambulatory Visit: Payer: Self-pay

## 2019-05-06 DIAGNOSIS — L700 Acne vulgaris: Secondary | ICD-10-CM

## 2019-05-06 LAB — CBC WITH DIFFERENTIAL/PLATELET
Basophils Absolute: 0 10*3/uL (ref 0.0–0.1)
Basophils Relative: 0.5 % (ref 0.0–3.0)
Eosinophils Absolute: 0 10*3/uL (ref 0.0–0.7)
Eosinophils Relative: 1.1 % (ref 0.0–5.0)
HCT: 28.5 % — ABNORMAL LOW (ref 36.0–46.0)
Hemoglobin: 8.9 g/dL — ABNORMAL LOW (ref 12.0–15.0)
Lymphocytes Relative: 36.4 % (ref 12.0–46.0)
Lymphs Abs: 1.3 10*3/uL (ref 0.7–4.0)
MCHC: 31.2 g/dL (ref 30.0–36.0)
MCV: 68.6 fl — ABNORMAL LOW (ref 78.0–100.0)
Monocytes Absolute: 0.3 10*3/uL (ref 0.1–1.0)
Monocytes Relative: 7.7 % (ref 3.0–12.0)
Neutro Abs: 1.9 10*3/uL (ref 1.4–7.7)
Neutrophils Relative %: 54.3 % (ref 43.0–77.0)
Platelets: 345 10*3/uL (ref 150.0–400.0)
RBC: 4.16 Mil/uL (ref 3.87–5.11)
RDW: 18.3 % — ABNORMAL HIGH (ref 11.5–15.5)
WBC: 3.5 10*3/uL — ABNORMAL LOW (ref 4.0–10.5)

## 2019-05-06 LAB — COMPREHENSIVE METABOLIC PANEL
ALT: 13 U/L (ref 0–35)
AST: 17 U/L (ref 0–37)
Albumin: 4.6 g/dL (ref 3.5–5.2)
Alkaline Phosphatase: 29 U/L — ABNORMAL LOW (ref 39–117)
BUN: 15 mg/dL (ref 6–23)
CO2: 26 mEq/L (ref 19–32)
Calcium: 9 mg/dL (ref 8.4–10.5)
Chloride: 105 mEq/L (ref 96–112)
Creatinine, Ser: 0.91 mg/dL (ref 0.40–1.20)
GFR: 73.05 mL/min (ref >60.00)
Glucose, Bld: 86 mg/dL (ref 70–99)
Potassium: 4.1 mEq/L (ref 3.5–5.1)
Sodium: 135 mEq/L (ref 135–145)
Total Bilirubin: 0.5 mg/dL (ref 0.2–1.2)
Total Protein: 7.3 g/dL (ref 6.0–8.3)

## 2019-05-07 ENCOUNTER — Other Ambulatory Visit (INDEPENDENT_AMBULATORY_CARE_PROVIDER_SITE_OTHER): Payer: 59

## 2019-05-07 DIAGNOSIS — D509 Iron deficiency anemia, unspecified: Secondary | ICD-10-CM

## 2019-05-07 LAB — FERRITIN: Ferritin: 2.3 ng/mL — ABNORMAL LOW (ref 10.0–291.0)

## 2019-06-25 ENCOUNTER — Encounter: Payer: Self-pay | Admitting: Family Medicine

## 2019-08-01 ENCOUNTER — Encounter: Payer: Self-pay | Admitting: Family Medicine

## 2019-09-30 ENCOUNTER — Encounter: Payer: Self-pay | Admitting: Family Medicine

## 2019-10-02 ENCOUNTER — Other Ambulatory Visit: Payer: Self-pay | Admitting: Family Medicine

## 2019-10-02 DIAGNOSIS — R5383 Other fatigue: Secondary | ICD-10-CM

## 2019-10-02 DIAGNOSIS — Z1321 Encounter for screening for nutritional disorder: Secondary | ICD-10-CM

## 2019-10-02 DIAGNOSIS — D509 Iron deficiency anemia, unspecified: Secondary | ICD-10-CM

## 2019-10-03 ENCOUNTER — Other Ambulatory Visit: Payer: Self-pay

## 2019-10-03 ENCOUNTER — Other Ambulatory Visit (INDEPENDENT_AMBULATORY_CARE_PROVIDER_SITE_OTHER): Payer: 59

## 2019-10-03 DIAGNOSIS — R5383 Other fatigue: Secondary | ICD-10-CM

## 2019-10-03 DIAGNOSIS — D509 Iron deficiency anemia, unspecified: Secondary | ICD-10-CM

## 2019-10-03 DIAGNOSIS — Z1321 Encounter for screening for nutritional disorder: Secondary | ICD-10-CM | POA: Diagnosis not present

## 2019-10-03 LAB — CBC WITH DIFFERENTIAL/PLATELET
Basophils Absolute: 0 10*3/uL (ref 0.0–0.1)
Basophils Relative: 0.8 % (ref 0.0–3.0)
Eosinophils Absolute: 0 10*3/uL (ref 0.0–0.7)
Eosinophils Relative: 0.8 % (ref 0.0–5.0)
HCT: 40.1 % (ref 36.0–46.0)
Hemoglobin: 13.2 g/dL (ref 12.0–15.0)
Lymphocytes Relative: 24.9 % (ref 12.0–46.0)
Lymphs Abs: 1.4 10*3/uL (ref 0.7–4.0)
MCHC: 32.9 g/dL (ref 30.0–36.0)
MCV: 87 fl (ref 78.0–100.0)
Monocytes Absolute: 0.3 10*3/uL (ref 0.1–1.0)
Monocytes Relative: 5.4 % (ref 3.0–12.0)
Neutro Abs: 3.8 10*3/uL (ref 1.4–7.7)
Neutrophils Relative %: 68.1 % (ref 43.0–77.0)
Platelets: 236 10*3/uL (ref 150.0–400.0)
RBC: 4.6 Mil/uL (ref 3.87–5.11)
RDW: 16.4 % — ABNORMAL HIGH (ref 11.5–15.5)
WBC: 5.6 10*3/uL (ref 4.0–10.5)

## 2019-10-03 LAB — TSH: TSH: 2.23 u[IU]/mL (ref 0.35–4.50)

## 2019-10-03 LAB — IBC + FERRITIN
Ferritin: 18.4 ng/mL (ref 10.0–291.0)
Iron: 80 ug/dL (ref 42–145)
Saturation Ratios: 24 % (ref 20.0–50.0)
Transferrin: 238 mg/dL (ref 212.0–360.0)

## 2019-10-03 LAB — VITAMIN B12: Vitamin B-12: 256 pg/mL (ref 211–911)

## 2019-10-03 LAB — VITAMIN D 25 HYDROXY (VIT D DEFICIENCY, FRACTURES): VITD: 43.22 ng/mL (ref 30.00–100.00)

## 2019-12-31 ENCOUNTER — Ambulatory Visit: Payer: 59 | Attending: Internal Medicine

## 2019-12-31 DIAGNOSIS — Z23 Encounter for immunization: Secondary | ICD-10-CM

## 2019-12-31 NOTE — Progress Notes (Signed)
   Covid-19 Vaccination Clinic  Name:  Jamie Weaver    MRN: [id number] DOB: [birth year]  12/31/2019  [name] was observed post Covid-19 immunization for 15 minutes without incident. She was provided with Vaccine Information Sheet and instruction to access the V-Safe system.   [name] was instructed to call 911 with any severe reactions post vaccine: Marland Kitchen Difficulty breathing  . Swelling of face and throat  . A fast heartbeat  . A bad rash all over body  . Dizziness and weakness   Immunizations Administered    Name Date Dose VIS Date Route   Pfizer COVID-19 Vaccine 12/31/2019 12:26 PM 0.3 mL 12/03/2019 Intramuscular   Manufacturer: [employer], [employer]   Lot: [id number]   NDC: [address]-1

## 2020-01-02 ENCOUNTER — Other Ambulatory Visit (HOSPITAL_COMMUNITY): Payer: Self-pay | Admitting: Internal Medicine

## 2020-08-31 ENCOUNTER — Telehealth: Payer: Self-pay

## 2020-08-31 DIAGNOSIS — Z862 Personal history of diseases of the blood and blood-forming organs and certain disorders involving the immune mechanism: Secondary | ICD-10-CM

## 2020-08-31 NOTE — Telephone Encounter (Signed)
Pt said she has a TOC and CPE if time to be done at that appt on 09/07/20 with Audria Nine NP. Pt is a Surveyor, mining and pt said she has a hx of anemia and since 08/29/20 when pt runs she has SOB which is a symptom pt has when she has anemia. Pt wants to have cbc and any other needed lab testing done prior to [facility] appt on 09/07/20. Explained would need to see provider first to see if other testing might be needed and would need order for lab testing and Audria Nine NP is not at [facility] yet. Pt said she wants labs prior to appt so can discuss at appt with Audria Nine NP.Pt stopped taking the iron couple times a wk a few months ago and pt never started the Vit B 12 per lab result note 10/03/19.; pt had CMP and CBC with lab on 05/06/19 and pt had CBC with diff, IBC and Ferritin, TSH and Vit D and Vit B12 on 10/03/19. Pt last saw Harlin Heys FNP to est care on 04/18/19. Pt said she is frustrated and I spoke with Carollee Herter RN who said to send note to Dr Para March for further advisement. Pt will wait for cb after reviewed by Dr Para March.

## 2020-09-01 NOTE — Telephone Encounter (Signed)
Spoke with patient about message below. She still wants to have these labs done before her appt and advised she may need more once she sees provider. Patient made lab appt. for tomorrow at 3:00 pm.

## 2020-09-01 NOTE — Telephone Encounter (Signed)
I agree with Rena.  I think it makes sense to get evaluated first and that is what I would normally recommend.  If the patient is really frustrated about this and wants to get the labs ahead of time then I am okay with that but she may end up needing additional labs at the visit.  I cannot guarantee that this order set will contain all needed labs given that I have never seen the patient.

## 2020-09-02 ENCOUNTER — Other Ambulatory Visit (INDEPENDENT_AMBULATORY_CARE_PROVIDER_SITE_OTHER): Payer: Self-pay

## 2020-09-02 ENCOUNTER — Other Ambulatory Visit: Payer: Self-pay

## 2020-09-02 DIAGNOSIS — Z862 Personal history of diseases of the blood and blood-forming organs and certain disorders involving the immune mechanism: Secondary | ICD-10-CM

## 2020-09-03 LAB — CBC WITH DIFFERENTIAL/PLATELET
Basophils Absolute: 0 10*3/uL (ref 0.0–0.1)
Basophils Relative: 0.9 % (ref 0.0–3.0)
Eosinophils Absolute: 0 10*3/uL (ref 0.0–0.7)
Eosinophils Relative: 0.7 % (ref 0.0–5.0)
HCT: 40.1 % (ref 36.0–46.0)
Hemoglobin: 13.1 g/dL (ref 12.0–15.0)
Lymphocytes Relative: 27.4 % (ref 12.0–46.0)
Lymphs Abs: 1.1 10*3/uL (ref 0.7–4.0)
MCHC: 32.8 g/dL (ref 30.0–36.0)
MCV: 87.8 fl (ref 78.0–100.0)
Monocytes Absolute: 0.2 10*3/uL (ref 0.1–1.0)
Monocytes Relative: 6 % (ref 3.0–12.0)
Neutro Abs: 2.7 10*3/uL (ref 1.4–7.7)
Neutrophils Relative %: 65 % (ref 43.0–77.0)
Platelets: 260 10*3/uL (ref 150.0–400.0)
RBC: 4.56 Mil/uL (ref 3.87–5.11)
RDW: 14.5 % (ref 11.5–15.5)
WBC: 4.1 10*3/uL (ref 4.0–10.5)

## 2020-09-03 LAB — COMPREHENSIVE METABOLIC PANEL
ALT: 17 U/L (ref 0–35)
AST: 20 U/L (ref 0–37)
Albumin: 4.8 g/dL (ref 3.5–5.2)
Alkaline Phosphatase: 35 U/L — ABNORMAL LOW (ref 39–117)
BUN: 14 mg/dL (ref 6–23)
CO2: 30 mEq/L (ref 19–32)
Calcium: 9.6 mg/dL (ref 8.4–10.5)
Chloride: 101 mEq/L (ref 96–112)
Creatinine, Ser: 0.98 mg/dL (ref 0.40–1.20)
GFR: 77.5 mL/min (ref >60.00)
Glucose, Bld: 92 mg/dL (ref 70–99)
Potassium: 4.3 mEq/L (ref 3.5–5.1)
Sodium: 139 mEq/L (ref 135–145)
Total Bilirubin: 0.8 mg/dL (ref 0.2–1.2)
Total Protein: 7.3 g/dL (ref 6.0–8.3)

## 2020-09-03 LAB — FERRITIN: Ferritin: 6.1 ng/mL — ABNORMAL LOW (ref 10.0–291.0)

## 2020-09-03 LAB — IRON: Iron: 46 ug/dL (ref 42–145)

## 2020-09-03 LAB — VITAMIN B12: Vitamin B-12: 366 pg/mL (ref 211–911)

## 2020-09-07 ENCOUNTER — Encounter: Payer: Self-pay | Admitting: Nurse Practitioner

## 2020-09-07 ENCOUNTER — Other Ambulatory Visit: Payer: Self-pay

## 2020-09-07 ENCOUNTER — Ambulatory Visit (INDEPENDENT_AMBULATORY_CARE_PROVIDER_SITE_OTHER): Payer: No Typology Code available for payment source | Admitting: Nurse Practitioner

## 2020-09-07 VITALS — BP 102/76 | HR 58 | Temp 98.8°F | Resp 12 | Ht 67.5 in | Wt 144.5 lb

## 2020-09-07 DIAGNOSIS — Z975 Presence of (intrauterine) contraceptive device: Secondary | ICD-10-CM

## 2020-09-07 DIAGNOSIS — Z7689 Persons encountering health services in other specified circumstances: Secondary | ICD-10-CM | POA: Diagnosis not present

## 2020-09-07 DIAGNOSIS — D509 Iron deficiency anemia, unspecified: Secondary | ICD-10-CM | POA: Insufficient documentation

## 2020-09-07 NOTE — Assessment & Plan Note (Signed)
Reviewed labs with patient at bedside.  She had seen them on the MyChart portal already.  Ferritin level was below normal.  Iron level was within normal limits but on the low end of the spectrum.  Patient has already placed herself on iron 325 mg 1 tablet daily.  We will continue that treatment for the next couple months.  Reviewed signs and symptoms of adverse events with patient inclusive of constipation, upset stomach, and dark tarry stools.  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

## 2020-09-07 NOTE — Patient Instructions (Signed)
Reviewing the notes you have a Liletta IUD that was placed in 08/2018. This should be good for 6 years. Anticipated removal date of 08/2024. Continue taking iron as you have. Watch out for the medication side effects discussed in office.  Follow up in 2 months for a CPE where we will recheck your iron studies.

## 2020-09-07 NOTE — Assessment & Plan Note (Signed)
Patient currently has Liletta IUD.  According to care everywhere was installed 08-2018.  According to note IUD can stay in place for 6 years.  Anticipated removal date of 08-2024.  Patient has no concerns or complaints in regards to IUD.  No referral needed at current time. Did offer to refer to GYN for IUD management, Pap smear, and breast exam.  She states she is comfortable with [place] doing those in office but understands that for removal and replacement of IUD will need to be done by a GYN office.  Will refer at time of service needed.

## 2020-09-07 NOTE — Assessment & Plan Note (Signed)
Here to establish care with me.  Transferring from previous clinician with an office.  Reviewed electronic medical records that were available.  Only history patient has his lymphocele removal and previous ankle debridement surgeries. We will see patient back in 2 months to recheck iron studies and do a complete physical at that point.  She was informed and agreed to treatment and management plan.

## 2020-09-07 NOTE — Progress Notes (Signed)
Established Patient Office Visit  Subjective:  Patient ID: Jamie Weaver, female    DOB: Jun 24, 1990  Age: 30 y.o. MRN: [id number]  CC:  Chief Complaint  Patient presents with   Transfer of Care    From [facility]   Referral    To see a [facility]-needs to have IUD taking out. Use to see [facility]   Discuss recent lab results    HPI Jamie Weaver presents for transfer of care from previous provider in clinic.  Patient also presents to review labs that were drawn previous to office visit.  She is also here to discuss referral for IUD removal.  States she was established at [facility] but has changed insurance network and not sure they are in network any longer.  Past Medical History:  Diagnosis Date   Lymphocoele    Right Inguinal lymphadenopathy 12/27/2017    Past Surgical History:  Procedure Laterality Date   ANKLE ARTHROSCOPY Left 02/01/2017   Procedure: ANKLE ARTHROSCOPY WITH DEBRIDMENT;  Surgeon: Christena Flake, MD;  Location: [facility] ORS;  Service: Orthopedics;  Laterality: Left;   INCISION AND DRAINAGE ABSCESS Right 02/20/2018   Procedure: DRAINAGE AND OVERSEWING OF RIGHT INGUINAL LYMPHOCOELE;  Surgeon: Ancil Linsey, MD;  Location: [facility] ORS;  Service: General;  Laterality: Right;   INGUINAL LYMPH NODE BIOPSY Right 01/04/2018   Procedure: INGUINAL LYMPH NODE BIOPSY;  Surgeon: Ancil Linsey, MD;  Location: [facility] ORS;  Service: General;  Laterality: Right;    Family History  Problem Relation Age of Onset   Arthritis Mother    Thyroid disease Mother        hypothyroidism   Dementia Maternal Grandmother    Cancer Paternal Grandmother    Heart attack Paternal Grandfather     Social History   Socioeconomic History   Marital status: Married    Spouse name: Not on file   Number of children: 2   Years of education: Not on file   Highest education level: Not on file  Occupational History   Occupation: student  Tobacco Use   Smoking status:  Never   Smokeless tobacco: Never  Vaping Use   Vaping Use: Never used  Substance and Sexual Activity   Alcohol use: Yes    Alcohol/week: 4.0 standard drinks    Types: 4 Cans of beer per week    Comment: 4 beers weekly   Drug use: No   Sexual activity: Yes  Other Topics Concern   Not on file  Social History Narrative   Not on file   Social Determinants of Health   Financial Resource Strain: Not on file  Food Insecurity: Not on file  Transportation Needs: Not on file  Physical Activity: Not on file  Stress: Not on file  Social Connections: Not on file  Intimate Partner Violence: Not on file    Outpatient Medications Prior to Visit  Medication Sig Dispense Refill   Ferrous Sulfate (IRON) 325 (65 Fe) MG TABS Take by mouth daily.     Levonorgestrel 13.5 MG IUD 1 Dose by Intrauterine route once.     COVID-19 mRNA vaccine, Pfizer, 30 MCG/0.3ML injection USE AS DIRECTED .3 mL 0   spironolactone (ALDACTONE) 50 MG tablet Take 1 tablet (50 mg total) by mouth daily. 90 tablet 1   No facility-administered medications prior to visit.    No Known Allergies  ROS Review of Systems  Constitutional:  Positive for fatigue. Negative for chills and fever.  Exertional fatigue  Cardiovascular:  Negative for chest pain.  Gastrointestinal:  Negative for constipation, diarrhea, nausea and vomiting.  Genitourinary:  Negative for menstrual problem, pelvic pain, vaginal bleeding and vaginal pain.     Objective:    Physical Exam Vitals and nursing note reviewed.  Constitutional:      Appearance: Normal appearance.  Cardiovascular:     Rate and Rhythm: Regular rhythm. Bradycardia present.     Heart sounds: Normal heart sounds.  Pulmonary:     Effort: Pulmonary effort is normal.     Breath sounds: Normal breath sounds.  Abdominal:     General: Bowel sounds are normal.  Lymphadenopathy:     Cervical: No cervical adenopathy.  Neurological:     Mental Status: She is alert.    BP  102/76   Pulse (!) 58   Temp 98.8 F (37.1 C)   Resp 12   Ht 5' 7.5" (1.715 m)   Wt 144 lb 8 oz (65.5 kg)   SpO2 97%   BMI 22.30 kg/m  Wt Readings from Last 3 Encounters:  09/07/20 144 lb 8 oz (65.5 kg)  04/18/19 151 lb 12.8 oz (68.9 kg)  03/05/18 147 lb (66.7 kg)     Health Maintenance Due  Topic Date Due   Pneumococcal Vaccine 68-80 Years old (1 - PCV) Never done   HIV Screening  Never done   Hepatitis C Screening  Never done   COVID-19 Vaccine (2 - Pfizer risk series) 01/21/2020   PAP SMEAR-Modifier  04/17/2020    There are no preventive care reminders to display for this patient.  Lab Results  Component Value Date   TSH 2.23 10/03/2019   Lab Results  Component Value Date   WBC 4.1 09/02/2020   HGB 13.1 09/02/2020   HCT 40.1 09/02/2020   MCV 87.8 09/02/2020   PLT 260.0 09/02/2020   Lab Results  Component Value Date   NA 139 09/02/2020   K 4.3 09/02/2020   CO2 30 09/02/2020   GLUCOSE 92 09/02/2020   BUN 14 09/02/2020   CREATININE 0.98 09/02/2020   BILITOT 0.8 09/02/2020   ALKPHOS 35 (L) 09/02/2020   AST 20 09/02/2020   ALT 17 09/02/2020   PROT 7.3 09/02/2020   ALBUMIN 4.8 09/02/2020   CALCIUM 9.6 09/02/2020   ANIONGAP 6 01/03/2018   GFR 77.50 09/02/2020   No results found for: CHOL No results found for: HDL No results found for: LDLCALC No results found for: TRIG No results found for: CHOLHDL No results found for: ZOXW9U    Assessment & Plan:   Problem List Items Addressed This Visit       Other   Establishing care with new doctor, encounter for - Primary    Here to establish care with me.  Transferring from previous clinician with an office.  Reviewed electronic medical records that were available.  Only history patient has his lymphocele removal and previous ankle debridement surgeries. We will see patient back in 2 months to recheck iron studies and do a complete physical at that point.  She was informed and agreed to treatment and  management plan.       Iron deficiency anemia    Reviewed labs with patient at bedside.  She had seen them on the MyChart portal already.  Ferritin level was below normal.  Iron level was within normal limits but on the low end of the spectrum.  Patient has already placed herself on iron 325 mg 1 tablet  daily.  We will continue that treatment for the next couple months.  Reviewed signs and symptoms of adverse events with patient inclusive of constipation, upset stomach, and dark tarry stools.  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       Relevant Medications   Ferrous Sulfate (IRON) 325 (65 Fe) MG TABS   IUD contraception    Patient currently has Liletta IUD.  According to care everywhere was installed 08-2018.  According to note IUD can stay in place for 6 years.  Anticipated removal date of 08-2024.  Patient has no concerns or complaints in regards to IUD.  No referral needed at current time. Did offer to refer to GYN for IUD management, Pap smear, and breast exam.  She states she is comfortable with [place] doing those in office but understands that for removal and replacement of IUD will need to be done by a GYN office.  Will refer at time of service needed.        No orders of the defined types were placed in this encounter.   Follow-up: Return in about 2 months (around 11/08/2020) for CPE and recheck Iron studies.    Audria Nine, NP

## 2020-10-27 ENCOUNTER — Other Ambulatory Visit: Payer: Self-pay | Admitting: Nurse Practitioner

## 2020-10-27 DIAGNOSIS — Z1321 Encounter for screening for nutritional disorder: Secondary | ICD-10-CM

## 2020-10-27 DIAGNOSIS — D509 Iron deficiency anemia, unspecified: Secondary | ICD-10-CM

## 2020-10-27 DIAGNOSIS — Z0000 Encounter for general adult medical examination without abnormal findings: Secondary | ICD-10-CM

## 2020-11-10 ENCOUNTER — Other Ambulatory Visit: Payer: No Typology Code available for payment source

## 2020-11-17 ENCOUNTER — Encounter: Payer: No Typology Code available for payment source | Admitting: Nurse Practitioner

## 2020-12-20 ENCOUNTER — Encounter: Payer: No Typology Code available for payment source | Admitting: Nurse Practitioner

## 2021-02-10 ENCOUNTER — Other Ambulatory Visit (HOSPITAL_COMMUNITY)
Admission: RE | Admit: 2021-02-10 | Discharge: 2021-02-10 | Disposition: A | Discharge status: Home / Self Care | Payer: No Typology Code available for payment source | Source: Ambulatory Visit | Attending: Nurse Practitioner | Admitting: Nurse Practitioner

## 2021-02-10 ENCOUNTER — Ambulatory Visit (INDEPENDENT_AMBULATORY_CARE_PROVIDER_SITE_OTHER)
Admission: RE | Admit: 2021-02-10 | Discharge: 2021-02-10 | Disposition: A | Discharge status: Home / Self Care | Payer: No Typology Code available for payment source | Source: Ambulatory Visit | Attending: Nurse Practitioner | Admitting: Nurse Practitioner

## 2021-02-10 ENCOUNTER — Other Ambulatory Visit: Payer: Self-pay

## 2021-02-10 ENCOUNTER — Ambulatory Visit (INDEPENDENT_AMBULATORY_CARE_PROVIDER_SITE_OTHER): Payer: No Typology Code available for payment source | Admitting: Nurse Practitioner

## 2021-02-10 ENCOUNTER — Encounter: Payer: Self-pay | Admitting: Nurse Practitioner

## 2021-02-10 VITALS — BP 98/58 | HR 54 | Temp 98.0°F | Resp 10 | Ht 68.0 in | Wt 152.1 lb

## 2021-02-10 DIAGNOSIS — M25562 Pain in left knee: Secondary | ICD-10-CM

## 2021-02-10 DIAGNOSIS — D509 Iron deficiency anemia, unspecified: Secondary | ICD-10-CM | POA: Diagnosis not present

## 2021-02-10 DIAGNOSIS — G8929 Other chronic pain: Secondary | ICD-10-CM

## 2021-02-10 DIAGNOSIS — Z124 Encounter for screening for malignant neoplasm of cervix: Secondary | ICD-10-CM | POA: Insufficient documentation

## 2021-02-10 DIAGNOSIS — N898 Other specified noninflammatory disorders of vagina: Secondary | ICD-10-CM

## 2021-02-10 DIAGNOSIS — Z0000 Encounter for general adult medical examination without abnormal findings: Secondary | ICD-10-CM | POA: Diagnosis not present

## 2021-02-10 DIAGNOSIS — F419 Anxiety disorder, unspecified: Secondary | ICD-10-CM | POA: Insufficient documentation

## 2021-02-10 HISTORY — DX: Encounter for general adult medical examination without abnormal findings: Z00.00

## 2021-02-10 LAB — CBC WITH DIFFERENTIAL/PLATELET
Basophils Absolute: 0 10*3/uL (ref 0.0–0.1)
Basophils Relative: 0.6 % (ref 0.0–3.0)
Eosinophils Absolute: 0.1 10*3/uL (ref 0.0–0.7)
Eosinophils Relative: 1.3 % (ref 0.0–5.0)
HCT: 41.9 % (ref 36.0–46.0)
Hemoglobin: 13.8 g/dL (ref 12.0–15.0)
Lymphocytes Relative: 24.1 % (ref 12.0–46.0)
Lymphs Abs: 1 10*3/uL (ref 0.7–4.0)
MCHC: 32.9 g/dL (ref 30.0–36.0)
MCV: 95.8 fl (ref 78.0–100.0)
Monocytes Absolute: 0.3 10*3/uL (ref 0.1–1.0)
Monocytes Relative: 8.6 % (ref 3.0–12.0)
Neutro Abs: 2.7 10*3/uL (ref 1.4–7.7)
Neutrophils Relative %: 65.4 % (ref 43.0–77.0)
Platelets: 226 10*3/uL (ref 150.0–400.0)
RBC: 4.38 Mil/uL (ref 3.87–5.11)
RDW: 14 % (ref 11.5–15.5)
WBC: 4.1 10*3/uL (ref 4.0–10.5)

## 2021-02-10 LAB — COMPREHENSIVE METABOLIC PANEL
ALT: 18 U/L (ref 0–35)
AST: 18 U/L (ref 0–37)
Albumin: 4.4 g/dL (ref 3.5–5.2)
Alkaline Phosphatase: 34 U/L — ABNORMAL LOW (ref 39–117)
BUN: 16 mg/dL (ref 6–23)
CO2: 28 mEq/L (ref 19–32)
Calcium: 9.3 mg/dL (ref 8.4–10.5)
Chloride: 103 mEq/L (ref 96–112)
Creatinine, Ser: 0.92 mg/dL (ref 0.40–1.20)
GFR: 83.35 mL/min (ref >60.00)
Glucose, Bld: 89 mg/dL (ref 70–99)
Potassium: 4.4 mEq/L (ref 3.5–5.1)
Sodium: 136 mEq/L (ref 135–145)
Total Bilirubin: 1.1 mg/dL (ref 0.2–1.2)
Total Protein: 6.7 g/dL (ref 6.0–8.3)

## 2021-02-10 LAB — IBC + FERRITIN
Ferritin: 36.9 ng/mL (ref 10.0–291.0)
Iron: 117 ug/dL (ref 42–145)
Saturation Ratios: 40 % (ref 20.0–50.0)
TIBC: 292.6 ug/dL (ref 250.0–450.0)
Transferrin: 209 mg/dL — ABNORMAL LOW (ref 212.0–360.0)

## 2021-02-10 LAB — FOLATE: Folate: >24.2 ng/mL (ref >5.9)

## 2021-02-10 LAB — LIPID PANEL
Cholesterol: 137 mg/dL (ref 0–200)
HDL: 48.4 mg/dL (ref >39.00)
LDL Cholesterol: 79 mg/dL (ref 0–99)
NonHDL: 88.47
Total CHOL/HDL Ratio: 3
Triglycerides: 48 mg/dL (ref 0.0–149.0)
VLDL: 9.6 mg/dL (ref 0.0–40.0)

## 2021-02-10 LAB — VITAMIN B12: Vitamin B-12: 399 pg/mL (ref 211–911)

## 2021-02-10 LAB — TSH: TSH: 1.09 u[IU]/mL (ref 0.35–5.50)

## 2021-02-10 MED ORDER — HYDROXYZINE PAMOATE 25 MG PO CAPS: 25.0000 mg | ORAL_CAPSULE | Freq: Two times a day (BID) | ORAL | 0 refills | Status: DC | PRN

## 2021-02-10 NOTE — Assessment & Plan Note (Signed)
Discussed age-appropriate vaccines and screening tests with patient.  Continue living healthy lifestyle.
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
## 2021-02-10 NOTE — Assessment & Plan Note (Signed)
Describes more of a situational and not frequent.  Discussed different treatment options we will start with hydroxyzine 25 mg twice daily as needed anxiety.  Discussed this with patient.  Continue to monitor

## 2021-02-10 NOTE — Patient Instructions (Addendum)
Nice to see you today I will be in touch with lab results Follow up in 1 year sooner if needed

## 2021-02-10 NOTE — Assessment & Plan Note (Signed)
Is a dental finding on exam.  No concern for STI per patient report states partner has been tested previously and was negative.  We will send off wet prep and GC chlamydia to verify.  Pending results

## 2021-02-10 NOTE — Assessment & Plan Note (Signed)
Left knee pain has been persistent for 6 months.  She thinks is a soft tissue injury and think she has an MRI.  We will obtain plain x-ray films of left knee before pursuing MRI.  Continue to monitor

## 2021-02-11 ENCOUNTER — Encounter: Payer: Self-pay | Admitting: Nurse Practitioner

## 2021-02-11 ENCOUNTER — Other Ambulatory Visit: Payer: Self-pay | Admitting: Nurse Practitioner

## 2021-02-11 DIAGNOSIS — M25562 Pain in left knee: Secondary | ICD-10-CM

## 2021-02-11 LAB — C. TRACHOMATIS/N. GONORRHOEAE RNA
C. trachomatis RNA, TMA: NOT DETECTED
N. gonorrhoeae RNA, TMA: NOT DETECTED

## 2021-02-11 LAB — WET PREP BY MOLECULAR PROBE
Candida species: NOT DETECTED
MICRO NUMBER:: 12808848
SPECIMEN QUALITY:: ADEQUATE
Trichomonas vaginosis: NOT DETECTED

## 2021-02-15 LAB — CYTOLOGY - PAP
Comment: NEGATIVE
Diagnosis: NEGATIVE
High risk HPV: NEGATIVE

## 2021-03-02 ENCOUNTER — Encounter: Payer: Self-pay | Admitting: Nurse Practitioner

## 2021-03-03 ENCOUNTER — Other Ambulatory Visit: Payer: Self-pay

## 2021-03-03 ENCOUNTER — Ambulatory Visit (INDEPENDENT_AMBULATORY_CARE_PROVIDER_SITE_OTHER): Payer: No Typology Code available for payment source | Admitting: Nurse Practitioner

## 2021-03-03 VITALS — BP 96/62 | HR 61 | Temp 98.0°F | Resp 12 | Ht 68.0 in | Wt 149.0 lb

## 2021-03-03 DIAGNOSIS — N898 Other specified noninflammatory disorders of vagina: Secondary | ICD-10-CM | POA: Diagnosis not present

## 2021-03-03 DIAGNOSIS — Z7251 High risk heterosexual behavior: Secondary | ICD-10-CM

## 2021-03-03 LAB — POCT URINE PREGNANCY: Preg Test, Ur: NEGATIVE

## 2021-03-03 NOTE — Patient Instructions (Signed)
Nice to see you today I will be in touch with the lab results Abstain from sexual activity until results are back Follow up if no improvement in your symptoms or they worsen

## 2021-03-03 NOTE — Assessment & Plan Note (Signed)
Did defer pelvic exam today.  Did discuss with patient limitations of doing self swab.  After conversation agreed to pursue self swab and deferred pelvic exam currently.  Pending lab results

## 2021-03-03 NOTE — Assessment & Plan Note (Signed)
Patient did have a encounter with a new sexual partner and had unprotected intercourse.  Pending lab results did encourage patient to abstain from sexual activity while results are pending

## 2021-03-03 NOTE — Progress Notes (Signed)
Acute Office Visit  Subjective:    Patient ID: Jamie Weaver, female    DOB: [birth year], 31 y.o.   MRN: [id number]  Chief Complaint  Patient presents with   Vaginal Discharge    Started about 3 to 4 days ago-noticed a brownish looking discharge. Nothing today. No itching or burning sensation.     Patient is in today for Vaginal discharge  States that she noticed it approx 3-4 days ago. Described as a brownish color. Reminds her of after having a menstrual cycle but given her IUD she does not have menstrual cycles currently.   States that she did have unprotected intercourse approx 2 weeks ago with a new partner, towards the end of December.  Was recently seen by me in office for her annual exam and testing was done at that point and was negative   Past Medical History:  Diagnosis Date   Lymphocoele    Right Inguinal lymphadenopathy 12/27/2017    Past Surgical History:  Procedure Laterality Date   ANKLE ARTHROSCOPY Left 02/01/2017   Procedure: ANKLE ARTHROSCOPY WITH DEBRIDMENT;  Surgeon: Corky Mull, MD;  Location: [facility];  Service: Orthopedics;  Laterality: Left;   INCISION AND DRAINAGE ABSCESS Right 02/20/2018   Procedure: DRAINAGE AND OVERSEWING OF RIGHT INGUINAL LYMPHOCOELE;  Surgeon: Vickie Epley, MD;  Location: [facility];  Service: General;  Laterality: Right;   INGUINAL LYMPH NODE BIOPSY Right 01/04/2018   Procedure: INGUINAL LYMPH NODE BIOPSY;  Surgeon: Vickie Epley, MD;  Location: [facility];  Service: General;  Laterality: Right;    Family History  Problem Relation Age of Onset   Arthritis Mother    Thyroid disease Mother        hypothyroidism   Dementia Maternal Grandmother    Cancer Paternal Grandmother 29       abdominal cancer   Heart attack Paternal Grandfather     Social History   Socioeconomic History   Marital status: Married    Spouse name: Not on file   Number of children: 2   Years of education: Not on file   Highest education  level: Not on file  Occupational History   Occupation: student  Tobacco Use   Smoking status: Never   Smokeless tobacco: Never  Vaping Use   Vaping Use: Never used  Substance and Sexual Activity   Alcohol use: Yes    Alcohol/week: 4.0 standard drinks    Types: 4 Cans of beer per week    Comment: 4 beers weekly at most   Drug use: No   Sexual activity: Yes  Other Topics Concern   Not on file  Social History Narrative   Not on file   Social Determinants of Health   Financial Resource Strain: Not on file  Food Insecurity: Not on file  Transportation Needs: Not on file  Physical Activity: Not on file  Stress: Not on file  Social Connections: Not on file  Intimate Partner Violence: Not on file    Outpatient Medications Prior to Visit  Medication Sig Dispense Refill   Ferrous Sulfate (IRON) 325 (65 Fe) MG TABS Take by mouth daily.     hydrOXYzine (VISTARIL) 25 MG capsule Take 1 capsule (25 mg total) by mouth 2 (two) times daily as needed. 30 capsule 0   Levonorgestrel 13.5 MG IUD 1 Dose by Intrauterine route once.     Probiotic Product (PROBIOTIC-10 PO) Take by mouth. 1 tablet daily     No facility-administered medications prior  to visit.    No Known Allergies  Review of Systems  Constitutional:  Negative for chills and fever.  Gastrointestinal:  Negative for abdominal pain, nausea and vomiting.  Genitourinary:  Positive for vaginal discharge. Negative for dysuria, frequency, hematuria, vaginal bleeding and vaginal pain.      Objective:    Physical Exam Vitals and nursing note reviewed.  Constitutional:      Appearance: Normal appearance.  Cardiovascular:     Rate and Rhythm: Normal rate and regular rhythm.  Pulmonary:     Effort: Pulmonary effort is normal.     Breath sounds: Normal breath sounds.  Abdominal:     General: Bowel sounds are normal. There is no distension.     Palpations: There is no mass.     Tenderness: There is no abdominal tenderness.   Genitourinary:    Comments: Deferred  Neurological:     Mental Status: She is alert.    BP 96/62    Pulse 61    Temp 98 F (36.7 C)    Resp 12    Ht 5\' 8"  (1.727 m)    Wt 149 lb (67.6 kg)    SpO2 97%    BMI 22.66 kg/m  Wt Readings from Last 3 Encounters:  03/03/21 149 lb (67.6 kg)  02/10/21 152 lb 2 oz (69 kg)  09/07/20 144 lb 8 oz (65.5 kg)    Health Maintenance Due  Topic Date Due   HIV Screening  Never done   Hepatitis C Screening  Never done   COVID-19 Vaccine (4 - Booster for [place] series) 02/25/2020    There are no preventive care reminders to display for this patient.   Lab Results  Component Value Date   TSH 1.09 02/10/2021   Lab Results  Component Value Date   WBC 4.1 02/10/2021   HGB 13.8 02/10/2021   HCT 41.9 02/10/2021   MCV 95.8 02/10/2021   PLT 226.0 02/10/2021   Lab Results  Component Value Date   NA 136 02/10/2021   K 4.4 02/10/2021   CO2 28 02/10/2021   GLUCOSE 89 02/10/2021   BUN 16 02/10/2021   CREATININE 0.92 02/10/2021   BILITOT 1.1 02/10/2021   ALKPHOS 34 (L) 02/10/2021   AST 18 02/10/2021   ALT 18 02/10/2021   PROT 6.7 02/10/2021   ALBUMIN 4.4 02/10/2021   CALCIUM 9.3 02/10/2021   ANIONGAP 6 01/03/2018   GFR 83.35 02/10/2021   Lab Results  Component Value Date   CHOL 137 02/10/2021   Lab Results  Component Value Date   HDL 48.40 02/10/2021   Lab Results  Component Value Date   LDLCALC 79 02/10/2021   Lab Results  Component Value Date   TRIG 48.0 02/10/2021   Lab Results  Component Value Date   CHOLHDL 3 02/10/2021   No results found for: HGBA1C     Assessment & Plan:   Problem List Items Addressed This Visit       Other   Vaginal discharge - Primary    Did defer pelvic exam today.  Did discuss with patient limitations of doing self swab.  After conversation agreed to pursue self swab and deferred pelvic exam currently.  Pending lab results      Relevant Orders   WET PREP BY MOLECULAR PROBE   C.  trachomatis/N. gonorrhoeae RNA   High risk heterosexual behavior    Patient did have a encounter with a new sexual partner and had unprotected intercourse.  Pending  lab results did encourage patient to abstain from sexual activity while results are pending      Relevant Orders   WET PREP BY MOLECULAR PROBE   POCT urine pregnancy   C. trachomatis/N. gonorrhoeae RNA     No orders of the defined types were placed in this encounter.  This visit occurred during the SARS-CoV-2 public health emergency.  Safety protocols were in place, including screening questions prior to the visit, additional usage of staff PPE, and extensive cleaning of exam room while observing appropriate contact time as indicated for disinfecting solutions.   Romilda Garret, NP

## 2021-03-04 LAB — WET PREP BY MOLECULAR PROBE
Candida species: NOT DETECTED
MICRO NUMBER:: 12892531
SPECIMEN QUALITY:: ADEQUATE
Trichomonas vaginosis: NOT DETECTED

## 2021-03-04 LAB — C. TRACHOMATIS/N. GONORRHOEAE RNA
C. trachomatis RNA, TMA: NOT DETECTED
N. gonorrhoeae RNA, TMA: NOT DETECTED

## 2021-03-06 ENCOUNTER — Other Ambulatory Visit: Payer: Self-pay

## 2021-03-06 ENCOUNTER — Ambulatory Visit
Admission: RE | Admit: 2021-03-06 | Discharge: 2021-03-06 | Disposition: A | Discharge status: Home / Self Care | Payer: No Typology Code available for payment source | Source: Ambulatory Visit | Attending: Nurse Practitioner | Admitting: Nurse Practitioner

## 2021-03-06 DIAGNOSIS — G8929 Other chronic pain: Secondary | ICD-10-CM | POA: Insufficient documentation

## 2021-03-06 DIAGNOSIS — M25562 Pain in left knee: Secondary | ICD-10-CM | POA: Diagnosis present

## 2021-03-07 ENCOUNTER — Encounter: Payer: Self-pay | Admitting: Nurse Practitioner

## 2021-03-14 ENCOUNTER — Other Ambulatory Visit: Payer: Self-pay

## 2021-03-14 ENCOUNTER — Ambulatory Visit (INDEPENDENT_AMBULATORY_CARE_PROVIDER_SITE_OTHER): Payer: No Typology Code available for payment source | Admitting: Family Medicine

## 2021-03-14 ENCOUNTER — Encounter: Payer: Self-pay | Admitting: Family Medicine

## 2021-03-14 VITALS — BP 94/60 | HR 57 | Temp 98.4°F | Ht 68.0 in | Wt 150.0 lb

## 2021-03-14 DIAGNOSIS — M25562 Pain in left knee: Secondary | ICD-10-CM

## 2021-03-14 DIAGNOSIS — G8929 Other chronic pain: Secondary | ICD-10-CM

## 2021-03-14 MED ORDER — TRIAMCINOLONE ACETONIDE 40 MG/ML IJ SUSP
40.0000 mg | Freq: Once | INTRAMUSCULAR | Status: AC
  Administered 2021-03-14: 40 mg via INTRA_ARTICULAR

## 2021-03-14 NOTE — Progress Notes (Signed)
Jamie Sprunger T. Fowler Antos, MD, CAQ Sports Medicine [facility] [address] [place], [address]  Phone: [phone]   FAX: [phone]  [name] - 31 y.o. female   MRN [id number]   Date of Birth: 1991/02/04  Date: 03/14/2021   PCP: Jamie Emms, NP   Referral: Jamie Emms, NP  Chief Complaint  Patient presents with   Knee Pain    Left-MRI done 03/06/21-Wants Knee Injection    This visit occurred during the SARS-CoV-2 public health emergency.  Safety protocols were in place, including screening questions prior to the visit, additional usage of staff PPE, and extensive cleaning of exam room while observing appropriate contact time as indicated for disinfecting solutions.   Subjective:   Jamie Weaver is a 31 y.o. very pleasant female patient with Body mass index is 22.81 kg/m. who presents with the following:  She is a very pleasant young lady who presents with some ongoing left-sided knee pain seen in consultation from Mr. Cable.   She is here after a twisting injury, and also reviewed her plain films.  Knee injury occurred in July. She is able to run about 3 miles right now, but uncomfortable if she does more than this, and the pain is posterior and slightly lateral.  No pain at the medial or lateral joint line.   Plain x-rays of her left knee are normal without any evidence of fracture, loose body, or loss of joint space.  MRI of the left knee is also very reassuring, and basically normal.  No ligamentous injury, menisci look good, bone as well as cartilage appear fine.  Radiology did remark about a plical band.  Initially, she did feel a pain in the most proximal portion of the posterior lower leg, roughly in the calf, but she did not have any swelling, bruising, or pain near the musculotendinous junction.  She did take 6 weeks off, and right now she is not been running for a few weeks as well.  She used to be a Systems developer, but right now she has not played at all in the last 6 months other than some basic things with her 26-year-old son.  NSAIDS have not been helpful at all.  H/o ankle arthroscopy and R ankle fracture distantly.  Review of Systems is noted in the HPI, as appropriate    Objective:   BP 94/60    Pulse (!) 57    Temp 98.4 F (36.9 C) (Temporal)    Ht 5\' 8"  (1.727 m)    Wt 150 lb (68 kg)    SpO2 98%    BMI 22.81 kg/m   GEN: No acute distress; alert,appropriate. PULM: Breathing comfortably in no respiratory distress PSYCH: Normally interactive.   Normal gait.  She is able to walk on toes and heels without any difficulty.  Calf raise normal.  Both knees are hyperextensible by 5 degrees.  Flexion to 135. Medial and lateral joint lines are nontender.  Minimal tenderness with loading the medial lateral patellar facets. Stable to varus and valgus stress. Lachman, anterior, posterior drawer testing is normal. Hip rotational movements with the hip flexed to 90 degrees are entirely normal and nontender. No painful plica.  Strength testing throughout the leg, knee and hip are quite strong and 5/5 Neurovascularly intact.  Concentric and hamstring strength testing is normal, but there is some pain roughly at 30 degrees with eccentric strength training, more notable with external rotation.  Resisted external rotation does cause some pain in the posterior lateral knee, while the internal range of motion does not cause pain.  There is also some pain with palpation of the posterior lateral knee.  Laboratory and Imaging Data: CLINICAL DATA:  Trauma to the left knee.   EXAM: LEFT KNEE - COMPLETE 4+ VIEW   COMPARISON:  None.   FINDINGS: No acute fracture or dislocation. The bones are well mineralized. No arthritic changes. No joint effusion. The soft tissues are unremarkable.   IMPRESSION: Negative.     Electronically Signed   By: Elgie Collard M.D.   On: 02/11/2021  02:10  CLINICAL DATA:  Twisting injury left knee left knee pain posteriorly.   EXAM: MRI OF THE LEFT KNEE WITHOUT CONTRAST   TECHNIQUE: Multiplanar, multisequence MR imaging of the knee was performed. No intravenous contrast was administered.   COMPARISON:  Radiographs 02/10/2021   FINDINGS: MENISCI   Medial meniscus:  Unremarkable   Lateral meniscus:  Unremarkable   LIGAMENTS   Cruciates:  Unremarkable   Collaterals:  Unremarkable   CARTILAGE   Patellofemoral:  Unremarkable   Medial:  Unremarkable   Lateral:  Unremarkable   Joint:  Small knee joint effusion.  Thin medial plica.   Popliteal Fossa:  Unremarkable   Extensor Mechanism:  Unremarkable   Bones: No significant extra-articular osseous abnormalities identified.   Other: No supplemental non-categorized findings.   IMPRESSION: 1. Small knee joint effusion. 2. Thin medial plica.     Electronically Signed   By: Gaylyn Rong M.D.   On: 03/07/2021 07:51  Assessment and Plan:     ICD-10-CM   1. Posterior knee pain, left  M25.562     2. Acute pain of left knee  M25.562 triamcinolone acetonide (KENALOG-40) injection 40 mg     Clinically consistent more with posterior lateral corner injury and more likely involving the lateral gastroc, popliteus.   I think that directing rehab in this area would be the most appropriate thing at this point.  Right now, she is unable to run several miles without pain. Think that she needs to dial it back, but this does not mean that she needs to take time off from weightbearing entirely.  I gave her a very conservative return to running protocol as well as some posterior knee rehab. I also think that some knee and lower extremity workouts in the pool would also be a good idea.  Given the length of time, we are also going to try an intra-articular injection.  Aspiration/Injection Procedure Note Jamie Weaver 1990-12-29 Date of procedure:  03/14/2021  Procedure: Large Joint Aspiration / Injection of Knee, L Indications: Pain  Procedure Details Patient verbally consented to procedure. Risks, benefits, and alternatives explained. Sterilely prepped with Chloraprep. Ethyl cholride used for anesthesia. 9 cc Lidocaine 1% mixed with 1 mL of Kenalog 40 mg injected using the anteromedial approach without difficulty. No complications with procedure and tolerated well. Patient had decreased pain post-injection. Medication: 1 mL of Kenalog 40 mg   Patient Instructions  BODYHELIX  Www.bodyhelix.com  Use website instuctions for measurement of limb to determine size.   Over the years, I have found that athletes and active people like these products a lot. Most of them cost about 40 dollars.  (I have no financial interest in this company and gain nothing from recommending their products)   FULL KNEE  OR ?  FULL CALF  Knee, Calf rehab  Begin with easy walking, heel, toe and  backwards:  Pick a room in your house and when you walk through it, go on toes or heels.  - Reverse calf raises:  2 sets, at least 10  Treadmill: Calf and posterior lower extremity rehab  Forward Walking: Go light 2 mins, easy about 2-3 mph: At Sideways Left: 2 mins, 0.6 - 0.8 mph Sideways Right: 2 mins, 0.6 - 0.8 mph Backwards, 2 mins, 1.8 - 2.2 mph Repeat, several cycles Goal is 30 minute  Start at a 3 degree incline - can slightly lower if necessary When able, increase to 3.5, then 4, then 4.5 (After you comfortably can do 30 minutes)    FOR WHEN YOU START RUNNING:  Start a walk-run progression: - Initially start one minute walking than one minute running for 20 mins in the first week,   then 25 mins during the second week, then 30 mins afterwards.  Once you have reached 30 mins: - Run 2 mins, then walk 1 min. -Then run 3 mins, and walk 1 min. -Then run 4 mins, and walk 1 min. -Then run 5 mins, and walk 1 min. -Slowly build up weekly to  running 30 mins nonstop.  If painful at any of the steps, back up one step.     Meds ordered this encounter  Medications   triamcinolone acetonide (KENALOG-40) injection 40 mg   There are no discontinued medications. No orders of the defined types were placed in this encounter.   Follow-up: Return in about 6 weeks (around 04/25/2021).  Dragon [employer] One speech-to-text software was used for transcription in this dictation.  Possible transcriptional errors can occur using Animal nutritionistDragon software.   Signed,  Elpidio GaleaSpencer T. Taeshawn Helfman, MD   Outpatient Encounter Medications as of 03/14/2021  Medication Sig   Ferrous Sulfate (IRON) 325 (65 Fe) MG TABS Take by mouth daily.   hydrOXYzine (VISTARIL) 25 MG capsule Take 1 capsule (25 mg total) by mouth 2 (two) times daily as needed.   Levonorgestrel 13.5 MG IUD 1 Dose by Intrauterine route once.   Probiotic Product (PROBIOTIC-10 PO) Take by mouth. 1 tablet daily   [EXPIRED] triamcinolone acetonide (KENALOG-40) injection 40 mg    No facility-administered encounter medications on file as of 03/14/2021.

## 2021-03-14 NOTE — Patient Instructions (Addendum)
BODYHELIX  Www.bodyhelix.com  Use website instuctions for measurement of limb to determine size.   Over the years, I have found that athletes and active people like these products a lot. Most of them cost about 40 dollars.  (I have no financial interest in this company and gain nothing from recommending their products)   FULL KNEE  OR ?  FULL CALF  Knee, Calf rehab  Begin with easy walking, heel, toe and backwards:  Pick a room in your house and when you walk through it, go on toes or heels.  - Reverse calf raises:  2 sets, at least 10  Treadmill: Calf and posterior lower extremity rehab  Forward Walking: Go light 2 mins, easy about 2-3 mph: At Sideways Left: 2 mins, 0.6 - 0.8 mph Sideways Right: 2 mins, 0.6 - 0.8 mph Backwards, 2 mins, 1.8 - 2.2 mph Repeat, several cycles Goal is 30 minute  Start at a 3 degree incline - can slightly lower if necessary When able, increase to 3.5, then 4, then 4.5 (After you comfortably can do 30 minutes)    FOR WHEN YOU START RUNNING:  Start a walk-run progression: - Initially start one minute walking than one minute running for 20 mins in the first week,   then 25 mins during the second week, then 30 mins afterwards.  Once you have reached 30 mins: - Run 2 mins, then walk 1 min. -Then run 3 mins, and walk 1 min. -Then run 4 mins, and walk 1 min. -Then run 5 mins, and walk 1 min. -Slowly build up weekly to running 30 mins nonstop.  If painful at any of the steps, back up one step.

## 2021-03-15 ENCOUNTER — Encounter: Payer: Self-pay | Admitting: Family Medicine

## 2021-04-27 ENCOUNTER — Ambulatory Visit: Payer: No Typology Code available for payment source | Admitting: Family Medicine

## 2021-06-15 ENCOUNTER — Encounter: Payer: Self-pay | Admitting: Nurse Practitioner

## 2021-06-15 DIAGNOSIS — F419 Anxiety disorder, unspecified: Secondary | ICD-10-CM

## 2021-06-24 ENCOUNTER — Other Ambulatory Visit: Payer: Self-pay | Admitting: Medical

## 2021-06-24 DIAGNOSIS — I6523 Occlusion and stenosis of bilateral carotid arteries: Secondary | ICD-10-CM

## 2021-08-11 ENCOUNTER — Ambulatory Visit (INDEPENDENT_AMBULATORY_CARE_PROVIDER_SITE_OTHER): Payer: No Typology Code available for payment source | Admitting: Psychologist

## 2021-08-11 DIAGNOSIS — F411 Generalized anxiety disorder: Secondary | ICD-10-CM

## 2021-08-11 NOTE — Progress Notes (Signed)
                Barbera Perritt, PsyD 

## 2021-08-11 NOTE — Plan of Care (Signed)
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
## 2021-09-13 ENCOUNTER — Ambulatory Visit (INDEPENDENT_AMBULATORY_CARE_PROVIDER_SITE_OTHER): Payer: No Typology Code available for payment source | Admitting: Psychologist

## 2021-09-13 DIAGNOSIS — F411 Generalized anxiety disorder: Secondary | ICD-10-CM | POA: Diagnosis not present

## 2021-09-13 NOTE — Progress Notes (Signed)
[facility] Counselor/Therapist Progress Note  Patient ID: Jamie Weaver, MRN: [id number],    Date: 09/13/2021  Time Spent: 10:08 am to 10:46 am; total time: 38 minutes   This session was held via video webex teletherapy due to the coronavirus risk at this time. The patient consented to video teletherapy and was located at her home during this session. She is aware it is the responsibility of the patient to secure confidentiality on her end of the session. The provider was in a private home office for the duration of this session. Limits of confidentiality were discussed with the patient.   Treatment Type: Individual Therapy  Reported Symptoms: Mild distress due to end of a relationship  Mental Status Exam: Appearance:  Well Groomed     Behavior: Appropriate  Motor: Normal  Speech/Language:  Clear and Coherent  Affect: Appropriate  Mood: normal  Thought process: normal  Thought content:   WNL  Sensory/Perceptual disturbances:   WNL  Orientation: oriented to person, place, and time/date  Attention: Good  Concentration: Good  Memory: WNL  Fund of knowledge:  Good  Insight:   Fair  Judgment:  Fair  Impulse Control: Good   Risk Assessment: Danger to Self:  No Self-injurious Behavior: No Danger to Others: No Duty to Warn:no Physical Aggression / Violence:No  Access to Firearms a concern: No  Gang Involvement:No   Subjective: Beginning the session, patient described herself as doing okay despite experiencing a challenging situation. After reviewing the treatment plan, patient spent the session reflecting on the end of a relationship with her boyfriend. She processed thoughts and emotions. She was agreeable to homework and following up. She denied suicidal and homicidal ideation.    Interventions:  Worked on developing a therapeutic relationship with the patient. Used active listening and reflective statements. Provided emotional support using empathy and  validation. Reviewed the treatment plan with the patient. Reviewed events since the intake occurred. Normalized and validated expressed thoughts and emotions. Identified goals for the session. Reflected on the end of the relationship. Processed thoughts and emotions. Used socratic questions to assist the patient gain insight into self. Provided reflective statements. Explored the etiology that led to the end of the relationship. Reflected on whether reconciliation is possible. Assigned homework. Provided empathic statements. Assessed for suicidal and homicidal ideation.   Homework: Reflect on what interdependence looks like to the patient  Next Session: Review homework and emotional support.   Diagnosis: F41.1 generalized anxiety disorder  Plan:  Goals Reduce overall frequency, intensity, and duration of anxiety Stabilize anxiety level wile increasing ability to function Enhance ability to effectively cope with full variety of stressors Learn and implement coping skills that result in a reduction of anxiety   Objectives target date for all objectives is 09/14/2022 Verbalize an understanding of the cognitive, physiological, and behavioral components of anxiety Learning and implement calming skills to reduce overall anxiety Verbalize an understanding of the role that cognitive biases play in excessive irrational worry and persistent anxiety symptoms Identify, challenge, and replace based fearful talk Learn and implement problem solving strategies Identify and engage in pleasant activities Learning and implement personal and interpersonal skills to reduce anxiety and improve interpersonal relationships Learn to accept limitations in life and commit to tolerating, rather than avoiding, unpleasant emotions while accomplishing meaningful goals Identify major life conflicts from the past and present that form the basis for present anxiety Maintain involvement in work, family, and social  activities Reestablish a consistent sleep-wake cycle Cooperate with a  medical evaluation  Interventions Engage the patient in behavioral activation Use instruction, modeling, and role-playing to build the client's general social, communication, and/or conflict resolution skills Use Acceptance and Commitment Therapy to help client accept uncomfortable realities in order to accomplish value-consistent goals Reinforce the client's insight into the role of his/her past emotional pain and present anxiety  Support the client in following through with work, family, and social activities Teach and implement sleep hygiene practices  Refer the patient to a physician for a psychotropic medication consultation Monito the clint's psychotropic medication compliance Discuss how anxiety typically involves excessive worry, various bodily expressions of tension, and avoidance of what is threatening that interact to maintain the problem  Teach the patient relaxation skills Assign the patient homework Discuss examples demonstrating that unrealistic worry overestimates the probability of threats and underestimates patient's ability  Assist the patient in analyzing his or her worries Help patient understand that avoidance is reinforcing   The patient and clinician reviewed the treatment plan on 09/13/2021. The patient approved of the treatment plan.    Hilbert Corrigan, PsyD

## 2021-09-13 NOTE — Progress Notes (Signed)
                Kameren Pargas, PsyD 

## 2021-10-18 ENCOUNTER — Ambulatory Visit (INDEPENDENT_AMBULATORY_CARE_PROVIDER_SITE_OTHER): Payer: No Typology Code available for payment source | Admitting: Psychologist

## 2021-10-18 DIAGNOSIS — F411 Generalized anxiety disorder: Secondary | ICD-10-CM | POA: Diagnosis not present

## 2021-10-18 NOTE — Progress Notes (Signed)
[place] [facility] Counselor/Therapist Progress Note  Patient ID: Jamie Weaver, MRN: [id number],    Date: 10/18/2021  Time Spent: 10:00 am to 10:41 am; total time: 41 minutes   This session was held via video webex teletherapy due to the coronavirus risk at this time. The patient consented to video teletherapy and was located at her home during this session. She is aware it is the responsibility of the patient to secure confidentiality on her end of the session. The provider was in a private home office for the duration of this session. Limits of confidentiality were discussed with the patient.   Treatment Type: Individual Therapy  Reported Symptoms: Distress trying to get emotional needs met from parents  Mental Status Exam: Appearance:  Well Groomed     Behavior: Appropriate  Motor: Normal  Speech/Language:  Clear and Coherent  Affect: Appropriate  Mood: normal  Thought process: normal  Thought content:   WNL  Sensory/Perceptual disturbances:   WNL  Orientation: oriented to person, place, and time/date  Attention: Good  Concentration: Good  Memory: WNL  Fund of knowledge:  Good  Insight:   Fair  Judgment:  Fair  Impulse Control: Good   Risk Assessment: Danger to Self:  No Self-injurious Behavior: No Danger to Others: No Duty to Warn:no Physical Aggression / Violence:No  Access to Firearms a concern: No  Gang Involvement:No   Subjective: Beginning the session, patient described herself as doing well as she reflected on events since the last session. Patient completed her homework assignment looking at what contributes to a healthy relationship. She processed thoughts and feelings regarding the list she created. Continuing to talk, she stated that she wanted to focus on herself. Elaborating, she spent the session reflecting on frustrations with her parents not meeting her emotional needs. She denied suicidal and homicidal ideation.    Interventions:  Worked on  developing a therapeutic relationship with the patient. Used active listening and reflective statements. Provided emotional support using empathy and validation. Reviewed the treatment plan with the patient. Used summary statements. Praised the patient for doing slightly better. Reviewed events since the last session. Praised the patient for completing the homework. Explored what she wanted to address from the list that she had created. Identified the theme of focusing on self. Processed what this looked like to the patient. Identified the theme of getting emotional needs met from parents. Reflected on etiology of barriers to getting emotional needs met. Used socratic questions to assist the patient gain insight into self. Challenged some of the thoughts expressed. Pointed out discrepancies that patient was expressing. Attempted to use an analogy to assist the patient. Attempted to assist with problem solving. Used MI to roll with the resistance. Processed thoughts and emotions. Provided empathic statements. Assessed for suicidal and homicidal ideation.   Homework: Reflect on what experiencing a healthy relationship and getting needs met  Next Session: Review homework and emotional support.   Diagnosis: F41.1 generalized anxiety disorder  Plan:  Goals Reduce overall frequency, intensity, and duration of anxiety Stabilize anxiety level wile increasing ability to function Enhance ability to effectively cope with full variety of stressors Learn and implement coping skills that result in a reduction of anxiety   Objectives target date for all objectives is 09/14/2022 Verbalize an understanding of the cognitive, physiological, and behavioral components of anxiety Learning and implement calming skills to reduce overall anxiety Verbalize an understanding of the role that cognitive biases play in excessive irrational worry and persistent anxiety symptoms  Identify, challenge, and replace based fearful  talk Learn and implement problem solving strategies Identify and engage in pleasant activities Learning and implement personal and interpersonal skills to reduce anxiety and improve interpersonal relationships Learn to accept limitations in life and commit to tolerating, rather than avoiding, unpleasant emotions while accomplishing meaningful goals Identify major life conflicts from the past and present that form the basis for present anxiety Maintain involvement in work, family, and social activities Reestablish a consistent sleep-wake cycle Cooperate with a medical evaluation  Interventions Engage the patient in behavioral activation Use instruction, modeling, and role-playing to build the client's general social, communication, and/or conflict resolution skills Use Acceptance and Commitment Therapy to help client accept uncomfortable realities in order to accomplish value-consistent goals Reinforce the client's insight into the role of his/her past emotional pain and present anxiety  Support the client in following through with work, family, and social activities Teach and implement sleep hygiene practices  Refer the patient to a physician for a psychotropic medication consultation Monito the clint's psychotropic medication compliance Discuss how anxiety typically involves excessive worry, various bodily expressions of tension, and avoidance of what is threatening that interact to maintain the problem  Teach the patient relaxation skills Assign the patient homework Discuss examples demonstrating that unrealistic worry overestimates the probability of threats and underestimates patient's ability  Assist the patient in analyzing his or her worries Help patient understand that avoidance is reinforcing   The patient and clinician reviewed the treatment plan on 09/13/2021. The patient approved of the treatment plan.    Conception Chancy, PsyD

## 2021-11-03 ENCOUNTER — Encounter: Payer: Self-pay | Admitting: Nurse Practitioner

## 2021-11-03 DIAGNOSIS — G8929 Other chronic pain: Secondary | ICD-10-CM

## 2021-11-04 NOTE — Telephone Encounter (Signed)
Will leave for [facility]. He may want to see her, last visit with Dr. Lorelei Pont in January.

## 2021-11-09 ENCOUNTER — Ambulatory Visit (INDEPENDENT_AMBULATORY_CARE_PROVIDER_SITE_OTHER): Payer: No Typology Code available for payment source | Admitting: Psychologist

## 2021-11-09 ENCOUNTER — Ambulatory Visit (INDEPENDENT_AMBULATORY_CARE_PROVIDER_SITE_OTHER): Payer: No Typology Code available for payment source | Admitting: Sports Medicine

## 2021-11-09 ENCOUNTER — Encounter: Payer: Self-pay | Admitting: Sports Medicine

## 2021-11-09 ENCOUNTER — Ambulatory Visit: Payer: Self-pay

## 2021-11-09 VITALS — Ht 67.0 in | Wt 145.0 lb

## 2021-11-09 DIAGNOSIS — G8929 Other chronic pain: Secondary | ICD-10-CM | POA: Diagnosis not present

## 2021-11-09 DIAGNOSIS — M25562 Pain in left knee: Secondary | ICD-10-CM

## 2021-11-09 DIAGNOSIS — S76312A Strain of muscle, fascia and tendon of the posterior muscle group at thigh level, left thigh, initial encounter: Secondary | ICD-10-CM | POA: Diagnosis not present

## 2021-11-09 DIAGNOSIS — F411 Generalized anxiety disorder: Secondary | ICD-10-CM | POA: Diagnosis not present

## 2021-11-09 MED ORDER — MELOXICAM 15 MG PO TABS: 15.0000 mg | ORAL_TABLET | Freq: Every day | ORAL | 0 refills | Status: AC

## 2021-11-09 NOTE — Progress Notes (Signed)
[facility] Counselor/Therapist Progress Note  Patient ID: Jamie Weaver, MRN: [id number],    Date: 11/09/2021  Time Spent: 01:10 pm to 01:40 pm; total time: 30 minutes   This session was held via video webex teletherapy due to the coronavirus risk at this time. The patient consented to video teletherapy and was located at her home during this session. She is aware it is the responsibility of the patient to secure confidentiality on her end of the session. The provider was in a private home office for the duration of this session. Limits of confidentiality were discussed with the patient.   Treatment Type: Individual Therapy  Reported Symptoms: Denied any concerns  Mental Status Exam: Appearance:  Well Groomed     Behavior: Appropriate  Motor: Normal  Speech/Language:  Clear and Coherent  Affect: Appropriate  Mood: normal  Thought process: normal  Thought content:   WNL  Sensory/Perceptual disturbances:   WNL  Orientation: oriented to person, place, and time/date  Attention: Good  Concentration: Good  Memory: WNL  Fund of knowledge:  Good  Insight:   Fair  Judgment:  Fair  Impulse Control: Good   Risk Assessment: Danger to Self:  No Self-injurious Behavior: No Danger to Others: No Duty to Warn:no Physical Aggression / Violence:No  Access to Firearms a concern: No  Gang Involvement:No   Subjective: Beginning the session, patient described herself as doing well and denied any immediate concerns. From there, she briefly talked about not getting questions answered to the end of the previous relationship, but being at peace with not having answers. She then stated that she wanted to work through trauma and voiced interest in EMDR. She was agreeable with the plan discussed. She denied suicidal and homicidal ideation.    Interventions:  Worked on developing a therapeutic relationship with the patient. Used active listening and reflective statements. Provided  emotional support using empathy and validation. Reviewed the treatment plan with the patient. Praised the patient for doing well and explored what has assisted the patient. Normalized and validated thoughts. Explored whether or not patient wanted to try to get answers to questions from previous relationship. Explored goals for the session. Provided psychoeducation about how trauma affects the brain and body. Provided psychoeducation about EMDR. Discussed next steps.. Provided empathic statements. Assessed for suicidal and homicidal ideation.   Homework: Look into EMDR as a treatment modality  Next Session: NA  Diagnosis: F41.1 generalized anxiety disorder  Plan:  Goals Reduce overall frequency, intensity, and duration of anxiety Stabilize anxiety level wile increasing ability to function Enhance ability to effectively cope with full variety of stressors Learn and implement coping skills that result in a reduction of anxiety   Objectives target date for all objectives is 09/14/2022 Verbalize an understanding of the cognitive, physiological, and behavioral components of anxiety Learning and implement calming skills to reduce overall anxiety Verbalize an understanding of the role that cognitive biases play in excessive irrational worry and persistent anxiety symptoms Identify, challenge, and replace based fearful talk Learn and implement problem solving strategies Identify and engage in pleasant activities Learning and implement personal and interpersonal skills to reduce anxiety and improve interpersonal relationships Learn to accept limitations in life and commit to tolerating, rather than avoiding, unpleasant emotions while accomplishing meaningful goals Identify major life conflicts from the past and present that form the basis for present anxiety Maintain involvement in work, family, and social activities Reestablish a consistent sleep-wake cycle Cooperate with a medical evaluation   Interventions  Engage the patient in behavioral activation Use instruction, modeling, and role-playing to build the client's general social, communication, and/or conflict resolution skills Use Acceptance and Commitment Therapy to help client accept uncomfortable realities in order to accomplish value-consistent goals Reinforce the client's insight into the role of his/her past emotional pain and present anxiety  Support the client in following through with work, family, and social activities Teach and implement sleep hygiene practices  Refer the patient to a physician for a psychotropic medication consultation Monito the clint's psychotropic medication compliance Discuss how anxiety typically involves excessive worry, various bodily expressions of tension, and avoidance of what is threatening that interact to maintain the problem  Teach the patient relaxation skills Assign the patient homework Discuss examples demonstrating that unrealistic worry overestimates the probability of threats and underestimates patient's ability  Assist the patient in analyzing his or her worries Help patient understand that avoidance is reinforcing   The patient and clinician reviewed the treatment plan on 09/13/2021. The patient approved of the treatment plan.    Conception Chancy, PsyD

## 2021-11-09 NOTE — Progress Notes (Signed)
pInjury last July; has had an MRI  Nagging, dull pain with extensive activity Wears a knee brace during activity  Posterior knee swelling  Did have 1 steroid injection; no relief  Denies OTC medication

## 2021-11-09 NOTE — Progress Notes (Signed)
Jamie Weaver - 31 y.o. female MRN [id number]  Date of birth: 1990/05/11  Office Visit Note: Visit Date: 11/09/2021 PCP: Michela Pitcher, NP Referred by: Michela Pitcher, NP  Subjective: Chief Complaint  Patient presents with   Left Knee - Pain   HPI: Jamie Weaver is a pleasant 31 y.o. female who presents today for chronic left knee pain.  She had a twisting injury last July where the lower half of the tibia externally rotated.  She denies feeling any pop but did have some pain and had to take some time off.  She had an x-ray and an MRI of the knee which were largely unremarkable.  She does wear knee brace during activity.  Feels like the pain is in the posterior aspect of the knee, feels like a swelling and sometimes a pulling sensation with activity.  She is able to run, but if she pushes her distance she feels some pain and almost like a swelling in the posterior aspect of the knee.  Been hesitant to get back into cutting activities.  Did have a intra-articular knee injection with corticosteroid last year without much relief.  Has taken over-the-counter and anti-inflammatories in the past, but nothing recently.  Of note, she states she had issues with ankle in the past and finally underwent an arthroscopy with a remove scar tissue and she had excellent pain relief from this.  Pertinent ROS were reviewed with the patient and found to be negative unless otherwise specified above in HPI.   Assessment & Plan: Visit Diagnoses:  1. Chronic pain of left knee   2. Hamstring muscle strain, left, initial encounter   3. Posterior left knee pain    Plan: Discussed with Kenora the likely etiology of her knee pain.  It is reassuring she has had negative x-rays and an MRI.  I limited ultrasound today shows a possible split tear near the insertion of the semitendinosis tendon, without avulsion or cortical irregularity.  Her injury was a twisting mechanism, and pain is in the posterior aspect of  the knee so I am favoring this being residual pain coming from the hamstring/semitendinosis versus popliteal muscle injury, again but her MRI was normal.  This is where I would like her physical therapy to be focused upon.  We will get her into formal PT.  I will see how she responds in about 6 weeks in follow-up.  We did discuss the option of possible PRP near this distal hamstring split tear plus or minus into the knee joint as well.   Follow-up: Return in about 6 weeks (around 12/21/2021).   Meds & Orders:  Meds ordered this encounter  Medications   meloxicam (MOBIC) 15 MG tablet    Sig: Take 1 tablet (15 mg total) by mouth daily.    Dispense:  30 tablet    Refill:  0    Orders Placed This Encounter  Procedures   [place] Extrem [place]   Ambulatory referral to Physical Therapy     Procedures: No procedures performed      Clinical History: No specialty comments available.  She reports that she has never smoked. She has never used smokeless tobacco. No results for input(s): "HGBA1C", "LABURIC" in the last 8760 hours.  Objective:   Vital Signs: Ht 5\' 7"  (1.702 m)   Wt 145 lb (65.8 kg)   BMI 22.71 kg/m   Physical Exam  Gen: Well-appearing, in no acute distress; non-toxic CV: Regular Rate. Well-perfused.  Warm.  Resp: Breathing unlabored on room air; no wheezing. Psych: Fluid speech in conversation; appropriate affect; normal thought process Neuro: Sensation intact throughout. No gross coordination deficits.   Ortho Exam - Left knee: There is some mild TTP with deep palpation near the distal insertion of the semitendinosis and semimembranosus muscles just proximal to the posterior medial tibial tuberosity.  No erythema, ecchymosis or gross effusion.  Very trace soft tissue swelling in the posterior medial knee.  Full range of motion from 0-135 degrees.  No patellar tenderness or crepitus.  Strength 5/5 with knee flexion and extension.  Negative McMurray's testing.  No varus or  valgus instability.  There is about 10 more degrees of external rotation at 30 degrees with a dial test, equivocal at 90 degrees.  Ligamentously intact, negative drawer tests.   Imaging: [place] Extrem Low Left Ltd  Result Date: 11/09/2021 [facility] Limited knee ultrasound performed, Left Evaluation of the medial and posterior knee was performed.  Popliteal fossa appears intact without any Baker's cyst formation.  Appropriate vasculature noted.  No cortical irregularity of the medial femoral condyle or lateral femoral condyle.  Evaluation of the semimembranosus and semitendinosis tendons near their insertion over the medial tibial tuberosity shows a possible split tear of the semitendinosis tendon coursing over the medial femoral condyle onto the tibial tuberosity.  There is associated sono palpation over this area.  Popliteus tendon incompletely visualized.       [place] Extrem Low Left Ltd [facility] Limited knee ultrasound performed, Left  Evaluation of the medial and posterior knee was performed.  Popliteal  fossa appears intact without any Baker's cyst formation.  Appropriate  vasculature noted.  No cortical irregularity of the medial femoral condyle  or lateral femoral condyle.  Evaluation of the semimembranosus and  semitendinosis tendons near their insertion over the medial tibial  tuberosity shows a possible split tear of the semitendinosis tendon  coursing over the medial femoral condyle onto the tibial tuberosity.   There is associated sono palpation over this area.  Popliteus tendon  incompletely visualized.      Past Medical/Family/Surgical/Social History: Medications & Allergies reviewed per EMR, new medications updated. Patient Active Problem List   Diagnosis Date Noted   High risk heterosexual behavior 03/03/2021   Preventative health care 02/10/2021   Vaginal discharge 02/10/2021   Chronic pain of left knee 02/10/2021   Anxiety 02/10/2021   Establishing care with new doctor, encounter  for 09/07/2020   Iron deficiency anemia 09/07/2020   IUD contraception 09/07/2020   Synovitis of left ankle 01/01/2017   Past Medical History:  Diagnosis Date   Lymphocoele    Right Inguinal lymphadenopathy 12/27/2017   Family History  Problem Relation Age of Onset   Arthritis Mother    Thyroid disease Mother        hypothyroidism   Dementia Maternal Grandmother    Cancer Paternal Grandmother 75       abdominal cancer   Heart attack Paternal Grandfather    Past Surgical History:  Procedure Laterality Date   ANKLE ARTHROSCOPY Left 02/01/2017   Procedure: ANKLE ARTHROSCOPY WITH DEBRIDMENT;  Surgeon: Corky Mull, MD;  Location: [facility] ORS;  Service: Orthopedics;  Laterality: Left;   INCISION AND DRAINAGE ABSCESS Right 02/20/2018   Procedure: DRAINAGE AND OVERSEWING OF RIGHT INGUINAL LYMPHOCOELE;  Surgeon: Vickie Epley, MD;  Location: [facility] OR[facility];  Service: General;  Laterality: Right;   INGUINAL LYMPH NODE BIOPSY Right 01/04/2018   Procedure: INGUINAL LYMPH NODE BIOPSY;  Surgeon:  Vickie Epley, MD;  Location: [facility];  Service: General;  Laterality: Right;   Social History   Occupational History   Occupation: student  Tobacco Use   Smoking status: Never   Smokeless tobacco: Never  Vaping Use   Vaping Use: Never used  Substance and Sexual Activity   Alcohol use: Yes    Alcohol/week: 4.0 standard drinks of alcohol    Types: 4 Cans of beer per week    Comment: 4 beers weekly at most   Drug use: No   Sexual activity: Yes

## 2021-11-14 ENCOUNTER — Encounter: Payer: Self-pay | Admitting: Nurse Practitioner

## 2021-11-14 DIAGNOSIS — F419 Anxiety disorder, unspecified: Secondary | ICD-10-CM

## 2021-11-14 NOTE — Telephone Encounter (Signed)
Just in case may need to put referral in

## 2021-11-17 ENCOUNTER — Encounter: Payer: Self-pay | Admitting: Rehabilitative and Restorative Service Providers"

## 2021-11-17 ENCOUNTER — Ambulatory Visit: Payer: No Typology Code available for payment source | Admitting: Rehabilitative and Restorative Service Providers"

## 2021-11-17 DIAGNOSIS — R600 Localized edema: Secondary | ICD-10-CM

## 2021-11-17 DIAGNOSIS — M6281 Muscle weakness (generalized): Secondary | ICD-10-CM | POA: Diagnosis not present

## 2021-11-17 DIAGNOSIS — M25562 Pain in left knee: Secondary | ICD-10-CM

## 2021-11-17 DIAGNOSIS — G8929 Other chronic pain: Secondary | ICD-10-CM

## 2021-11-17 DIAGNOSIS — R262 Difficulty in walking, not elsewhere classified: Secondary | ICD-10-CM

## 2021-11-17 NOTE — Therapy (Signed)
OUTPATIENT PHYSICAL THERAPY LOWER EXTREMITY EVALUATION   Patient Name: Jamie Weaver MRN: [id number] DOB:11/18/1990, 31 y.o., female Today's Date: 11/17/2021   PT End of Session - 11/17/21 1534     Visit Number 1    Number of Visits 12    PT Start Time 1430    PT Stop Time 1519    PT Time Calculation (min) 49 min    Activity Tolerance Patient tolerated treatment well    Behavior During Therapy [facility] for tasks assessed/performed             Past Medical History:  Diagnosis Date   Lymphocoele    Right Inguinal lymphadenopathy 12/27/2017   Past Surgical History:  Procedure Laterality Date   ANKLE ARTHROSCOPY Left 02/01/2017   Procedure: ANKLE ARTHROSCOPY WITH DEBRIDMENT;  Surgeon: Jamie Flake, MD;  Location: [facility];  Service: Orthopedics;  Laterality: Left;   INCISION AND DRAINAGE ABSCESS Right 02/20/2018   Procedure: DRAINAGE AND OVERSEWING OF RIGHT INGUINAL LYMPHOCOELE;  Surgeon: Jamie Linsey, MD;  Location: [facility];  Service: General;  Laterality: Right;   INGUINAL LYMPH NODE BIOPSY Right 01/04/2018   Procedure: INGUINAL LYMPH NODE BIOPSY;  Surgeon: Jamie Linsey, MD;  Location: [facility];  Service: General;  Laterality: Right;   Patient Active Problem List   Diagnosis Date Noted   High risk heterosexual behavior 03/03/2021   Preventative health care 02/10/2021   Vaginal discharge 02/10/2021   Chronic pain of left knee 02/10/2021   Anxiety 02/10/2021   Establishing care with new doctor, encounter for 09/07/2020   Iron deficiency anemia 09/07/2020   IUD contraception 09/07/2020   Synovitis of left ankle 01/01/2017    PCP: Jamie Emms, NP  REFERRING PROVIDER: Madelyn Brunner, DO  REFERRING DIAG: [phone] (ICD-10-CM) - Chronic pain of left knee   THERAPY DIAG:  Localized edema  Muscle weakness (generalized)  Chronic pain of left knee  Difficulty in walking, not elsewhere classified  Rationale for Evaluation and Treatment  Rehabilitation  ONSET DATE: July of 2022  SUBJECTIVE:   SUBJECTIVE STATEMENT: Jamie Weaver was playing with her children when she was hit on her lower leg (accidentally).  She reports she has had medial and posterior knee pain since.  She can do many things at the gym including nearly 20 minutes of stair climbing and 3 miles of running.  However, if she pushes past this, (for example 30 minutes of stairs or a 4 mile run), she will have increased medial and posterior Left knee pain.  This has been going on for 14 months.  PERTINENT HISTORY: Previous Lt ankle scope, chronic left knee pain, previous Lt ankle synovitis  PAIN:  Are you having pain? Yes: NPRS scale: 0-6/10 Pain location: Medial and posterior L knee Pain description: Dull, full, edema like feeling Aggravating factors: Running, cutting, pushing it with biking or any gym activity Relieving factors: Hold off heavier physical activities  PRECAUTIONS: Other: Avoid overuse!  WEIGHT BEARING RESTRICTIONS No  FALLS:  Has patient fallen in last 6 months? No  LIVING ENVIRONMENT: Lives with: lives with their family and children Lives in: House/apartment Stairs:  Ok with stairs other than overuse Has following equipment at home: None  OCCUPATION: PA in Cardiology  PLOF: Independent  PATIENT GOALS Get her back into normal activities without being stopped/limited by the L knee   OBJECTIVE:   DIAGNOSTIC FINDINGS: IMPRESSION: 1. Small knee joint effusion. 2. Thin medial plica.  PATIENT SURVEYS:  FOTO 76 (Goal 87)  COGNITION:  Overall cognitive status: Within functional limits for tasks assessed     SENSATION: No complaints of tingling or paresthesias  MUSCLE LENGTH: Hamstrings: Right 60 deg; Left 70 deg Quadriceps: Right 115 deg; Left 120 deg  PALPATION: Some medial hamstrings tenderness.  Consistent with subjective site of pain.  LOWER EXTREMITY ROM:  Active ROM Right 11/17/2021 Left 11/17/2021  Hip flexion     Hip extension    Hip abduction    Hip adduction    Hip internal rotation    Hip external rotation    Knee flexion 145 143  Knee extension 0 0  Ankle dorsiflexion    Ankle plantarflexion    Ankle inversion    Ankle eversion     (Blank rows = not tested)  LOWER EXTREMITY Strength: Thigh Girth assessed 15 cm proximal to the superior patellar pole Lt/Rt: 52.0 cm/52.5 cm  Strength in pounds assessed by hand-held dynamometer Right 11/17/2021 Left 11/17/2021  Hip flexion    Hip extension    Hip abduction    Hip adduction    Hip internal rotation    Hip external rotation    Knee flexion 46.2 42.6  Knee extension 98.6 93..5  Ankle dorsiflexion    Ankle plantarflexion    Ankle inversion    Ankle eversion     (Blank rows = not tested)  LOWER EXTREMITY SPECIAL TESTS:  Pain with resisted hamstrings MMT on the left   TODAY'S TREATMENT: Hamstrings Isometrics Seated and Supine 5X 5 seconds each Hamstrings curls (on machine): Pull back with Both legs and slow eccentrics with Left only 5X with 15# and 5X with 10#   PATIENT EDUCATION:  Education details: Reviewed exam findings and HEP Person educated: Patient Education method: Explanation, Demonstration, Tactile cues, Verbal cues, and Handouts Education comprehension: verbalized understanding, returned demonstration, verbal cues required, tactile cues required, and needs further education   HOME EXERCISE PROGRAM: Access Code: [email or web address] URL: [email or web address] Date: 11/17/2021 Prepared by: Jamie Weaver  Exercises - Seated Hamstring Set  - 3-5 x daily - 7 x weekly - 1 sets - 10 reps - 5 seconds hold  ASSESSMENT:  CLINICAL IMPRESSION: Patient is a 31 y.o. female who was seen today for physical therapy evaluation and treatment for chronic left knee pain.  Jamie Weaver is very physically active and is still working out 3-5X/week at [employer].  She can run 3 miles, do 20 minutes of stairs and do weights including  hamstrings curls with minimal irritation.  Because her injury is over a year ago, she knows that if she pushes it (for example runs 4 miles, does 25 minutes of stairs or increases her weights) she will have 6/10 Lt posterior and medial knee pain.  Testing was significant for B hamstrings weakness (<1:2 hamstrings:quadriceps strength ratio, 2:3 expected) and likely chronic tendonitis, possible partial semitendinosus tear.  She has taken up to a month off her workouts with no improvement.  A combination of strengthening, avoiding overuse, adequate rest and time will be needed to return her to her pre-injury level of function.   OBJECTIVE IMPAIRMENTS Abnormal gait, decreased activity tolerance, decreased endurance, decreased knowledge of condition, difficulty walking, decreased strength, increased edema, impaired perceived functional ability, and pain.   ACTIVITY LIMITATIONS stairs, locomotion level, and workout limitations  PARTICIPATION LIMITATIONS:  She is not as physically active nor does she have the endurance with workouts and lifestyle activities as she previously did  PERSONAL FACTORS No personal factors are also affecting patient's functional outcome.   REHAB  POTENTIAL: Good  CLINICAL DECISION MAKING: Stable/uncomplicated  EVALUATION COMPLEXITY: Low   GOALS: Goals reviewed with patient? Yes  SHORT TERM GOALS: Target date: 12/15/2021  Improve L hamstrings strength to > 50 pounds Baseline: 42.6 pounds Goal status: INITIAL   LONG TERM GOALS: Target date: 02/09/2022   Improve FOTO to 87 Baseline: 76 Goal status: INITIAL  2.  Improve posterior left knee pain to consistently 0-2/10 on the VAS Baseline: Can be 6/10 Goal status: INITIAL  3.  Improve B hamstrings strength to at least 60 pounds Baseline: See objective Goal status: INITIAL  4.  Rakeya will be able to run 4 miles, do 25+ minutes of stairs and gradually increase her gym program without being limited by L knee  pain. Baseline: 3 miles of running, 20 minutes of stairs and limited in gym activities Goal status: INITIAL  5.  Adrena will be independent with her long-term HEP at [place] Baseline: Started 11/17/2021 Goal status: INITIAL   PLAN: PT FREQUENCY: 1x/week or every other week (work schedule)  PT DURATION: 12 weeks  PLANNED INTERVENTIONS: Therapeutic exercises, Therapeutic activity, Neuromuscular re-education, Balance training, Gait training, Patient/Family education, Self Care, Stair training, Dry Needling, Electrical stimulation, Cryotherapy, and Vasopneumatic device  PLAN FOR NEXT SESSION: Review HEP, check on progress with the gym.  Preach avoiding overuse while strengthening hamstrings with a slow eccentrics emphasis while avoiding overuse.   Cherlyn Cushing, PT, MPT 11/17/2021, 3:36 PM

## 2021-11-30 ENCOUNTER — Ambulatory Visit (INDEPENDENT_AMBULATORY_CARE_PROVIDER_SITE_OTHER): Payer: No Typology Code available for payment source | Admitting: Rehabilitative and Restorative Service Providers"

## 2021-11-30 ENCOUNTER — Encounter: Payer: Self-pay | Admitting: Rehabilitative and Restorative Service Providers"

## 2021-11-30 DIAGNOSIS — R262 Difficulty in walking, not elsewhere classified: Secondary | ICD-10-CM

## 2021-11-30 DIAGNOSIS — G8929 Other chronic pain: Secondary | ICD-10-CM

## 2021-11-30 DIAGNOSIS — M6281 Muscle weakness (generalized): Secondary | ICD-10-CM

## 2021-11-30 DIAGNOSIS — M25562 Pain in left knee: Secondary | ICD-10-CM

## 2021-11-30 DIAGNOSIS — R600 Localized edema: Secondary | ICD-10-CM

## 2021-11-30 NOTE — Therapy (Signed)
OUTPATIENT PHYSICAL THERAPY TREATMENT   Patient Name: Jamie Weaver MRN: [id number] DOB:02-16-1990, 31 y.o., female Today's Date: 11/30/2021  PCP: Eden Emms, NP  REFERRING PROVIDER: Madelyn Brunner, DO  END OF SESSION:   PT End of Session - 11/30/21 1148     Visit Number 2    Number of Visits 12    PT Start Time 1145    PT Stop Time 1214    PT Time Calculation (min) 29 min    Activity Tolerance Patient tolerated treatment well    Behavior During Therapy [facility] for tasks assessed/performed              Past Medical History:  Diagnosis Date   Lymphocoele    Right Inguinal lymphadenopathy 12/27/2017   Past Surgical History:  Procedure Laterality Date   ANKLE ARTHROSCOPY Left 02/01/2017   Procedure: ANKLE ARTHROSCOPY WITH DEBRIDMENT;  Surgeon: Christena Flake, MD;  Location: [facility];  Service: Orthopedics;  Laterality: Left;   INCISION AND DRAINAGE ABSCESS Right 02/20/2018   Procedure: DRAINAGE AND OVERSEWING OF RIGHT INGUINAL LYMPHOCOELE;  Surgeon: Ancil Linsey, MD;  Location: [facility];  Service: General;  Laterality: Right;   INGUINAL LYMPH NODE BIOPSY Right 01/04/2018   Procedure: INGUINAL LYMPH NODE BIOPSY;  Surgeon: Ancil Linsey, MD;  Location: [facility];  Service: General;  Laterality: Right;   Patient Active Problem List   Diagnosis Date Noted   High risk heterosexual behavior 03/03/2021   Preventative health care 02/10/2021   Vaginal discharge 02/10/2021   Chronic pain of left knee 02/10/2021   Anxiety 02/10/2021   Establishing care with new doctor, encounter for 09/07/2020   Iron deficiency anemia 09/07/2020   IUD contraception 09/07/2020   Synovitis of left ankle 01/01/2017    REFERRING DIAG: M25.562,G89.29 (ICD-10-CM) - Chronic pain of left knee   THERAPY DIAG:  Localized edema  Muscle weakness (generalized)  Chronic pain of left knee  Difficulty in walking, not elsewhere classified  Rationale for Evaluation and Treatment  Rehabilitation  ONSET DATE: July of 2022  SUBJECTIVE:   SUBJECTIVE STATEMENT: Pt indicated feeling about the same overall since the last visit.  Pt indicated doing the exercise at the gym each time.  Pt indicated at home exercises aren't being done as many.   PERTINENT HISTORY: Previous Lt ankle scope, chronic left knee pain, previous Lt ankle synovitis  PAIN:  NPRS scale: no pain at rest upon arrival.  Pain location: Medial and posterior L knee Pain description: Dull, full, edema like feeling Aggravating factors: Running, cutting, pushing it with biking or any gym activity Relieving factors: Hold off heavier physical activities  PRECAUTIONS: Other: Avoid overuse!  WEIGHT BEARING RESTRICTIONS No  FALLS:  Has patient fallen in last 6 months? No  LIVING ENVIRONMENT: Lives with: lives with their family and children Lives in: House/apartment Stairs:  Ok with stairs other than overuse Has following equipment at home: None  OCCUPATION: PA in Cardiology  PLOF: Independent  PATIENT GOALS Get her back into normal activities without being stopped/limited by the L knee   OBJECTIVE:   DIAGNOSTIC FINDINGS: IMPRESSION: 1. Small knee joint effusion. 2. Thin medial plica.  PATIENT SURVEYS:  11/17/2021 FOTO 76 (Goal 87)  COGNITION: 11/17/2021  Overall cognitive status: Within functional limits for tasks assessed     SENSATION: 11/17/2021 No complaints of tingling or paresthesias  MUSCLE LENGTH: 11/17/2021 Hamstrings: Right 60 deg; Left 70 deg Quadriceps: Right 115 deg; Left 120 deg  PALPATION: 11/17/2021 Some medial  hamstrings tenderness.  Consistent with subjective site of pain.  LOWER EXTREMITY ROM:  Active ROM Right 11/17/2021 Left 11/17/2021  Hip flexion    Hip extension    Hip abduction    Hip adduction    Hip internal rotation    Hip external rotation    Knee flexion 145 143  Knee extension 0 0  Ankle dorsiflexion    Ankle plantarflexion    Ankle  inversion    Ankle eversion     (Blank rows = not tested)  LOWER EXTREMITY Strength: Thigh Girth assessed 15 cm proximal to the superior patellar pole Lt/Rt: 52.0 cm/52.5 cm  Strength in pounds assessed by hand-held dynamometer Right 11/17/2021 Left 11/17/2021 Left 11/30/2021  Hip flexion     Hip extension     Hip abduction     Hip adduction     Hip internal rotation     Hip external rotation     Knee flexion 46.2 42.6 5/5 c pain mild (testing in prone)  Knee extension 98.6 93..5   Ankle dorsiflexion     Ankle plantarflexion     Ankle inversion     Ankle eversion      (Blank rows = not tested)  LOWER EXTREMITY SPECIAL TESTS:  11/17/2021 Pain with resisted hamstrings MMT on the left   TODAY'S TREATMENT: 11/30/2021: Therex: Verbal review of existing HEP.  Cues for frequency and consistency. Supine leg outstretched hip extension isometrics 10 sec x 5 (cues for use at home, building to 30 seconds as appropraite) Supine single leg figure 4 bridge x 5 (cues for various positioning of foot to change loading Standing single leg romanian deadlift x 10 Lt unweighted.   11/17/2021 Hamstrings Isometrics Seated and Supine 5X 5 seconds each Hamstrings curls (on machine): Pull back with Both legs and slow eccentrics with Left only 5X with 15# and 5X with 10#   PATIENT EDUCATION:  11/17/2021 Education details: Reviewed exam findings and HEP Person educated: Patient Education method: Explanation, Demonstration, Tactile cues, Verbal cues, and Handouts Education comprehension: verbalized understanding, returned demonstration, verbal cues required, tactile cues required, and needs further education   HOME EXERCISE PROGRAM: Access Code: [email or web address] URL: [email or web address] Date: 11/17/2021 Prepared by: Vista Mink  Exercises - Seated Hamstring Set  - 3-5 x daily - 7 x weekly - 1 sets - 10 reps - 5 seconds hold  ASSESSMENT:  CLINICAL IMPRESSION: After discussion  c Pt, emphasis was put on routine daily use of intervention to get appropriate amount of reps in to match a good range for strengthening/tendon loading per research based guidelines.  Fair to good stability in single leg assessment today.  Pt to return in approx. 1 week with plan to include dynamic stability interventions based off symptom presentation.    OBJECTIVE IMPAIRMENTS Abnormal gait, decreased activity tolerance, decreased endurance, decreased knowledge of condition, difficulty walking, decreased strength, increased edema, impaired perceived functional ability, and pain.   ACTIVITY LIMITATIONS stairs, locomotion level, and workout limitations  PARTICIPATION LIMITATIONS:  She is not as physically active nor does she have the endurance with workouts and lifestyle activities as she previously did  PERSONAL FACTORS No personal factors are also affecting patient's functional outcome.   REHAB POTENTIAL: Good  CLINICAL DECISION MAKING: Stable/uncomplicated  EVALUATION COMPLEXITY: Low   GOALS: Goals reviewed with patient? Yes  SHORT TERM GOALS: Target date: 12/15/2021 Improve L hamstrings strength to > 50 pounds Baseline: 42.6 pounds Goal status: on going - 11/30/2021   LONG TERM GOALS: Target  date: 02/09/2022  Improve FOTO to 87 Baseline: 76 Goal status: INITIAL  2.  Improve posterior left knee pain to consistently 0-2/10 on the VAS Baseline: Can be 6/10 Goal status: INITIAL  3.  Improve B hamstrings strength to at least 60 pounds Baseline: See objective Goal status: INITIAL  4.  Allona will be able to run 4 miles, do 25+ minutes of stairs and gradually increase her gym program without being limited by L knee pain. Baseline: 3 miles of running, 20 minutes of stairs and limited in gym activities Goal status: INITIAL  5.  Chandni will be independent with her long-term HEP at [place] Baseline: Started 11/17/2021 Goal status: INITIAL   PLAN: PT FREQUENCY: 1x/week or every  other week (work schedule)  PT DURATION: 12 weeks  PLANNED INTERVENTIONS: Therapeutic exercises, Therapeutic activity, Neuromuscular re-education, Balance training, Gait training, Patient/Family education, Self Care, Stair training, Dry Needling, Electrical stimulation, Cryotherapy, and Vasopneumatic device  PLAN FOR NEXT SESSION:Check dynamometry testing.  Dynamic stability.    Chyrel Masson, PT, DPT, OCS, ATC 11/30/21  12:18 PM

## 2021-12-06 ENCOUNTER — Other Ambulatory Visit: Payer: Self-pay | Admitting: Sports Medicine

## 2021-12-07 ENCOUNTER — Encounter: Payer: Self-pay | Admitting: Physical Therapy

## 2021-12-07 ENCOUNTER — Ambulatory Visit (INDEPENDENT_AMBULATORY_CARE_PROVIDER_SITE_OTHER): Payer: No Typology Code available for payment source | Admitting: Physical Therapy

## 2021-12-07 DIAGNOSIS — R262 Difficulty in walking, not elsewhere classified: Secondary | ICD-10-CM

## 2021-12-07 DIAGNOSIS — M6281 Muscle weakness (generalized): Secondary | ICD-10-CM | POA: Diagnosis not present

## 2021-12-07 DIAGNOSIS — R600 Localized edema: Secondary | ICD-10-CM | POA: Diagnosis not present

## 2021-12-07 DIAGNOSIS — M25562 Pain in left knee: Secondary | ICD-10-CM | POA: Diagnosis not present

## 2021-12-07 DIAGNOSIS — G8929 Other chronic pain: Secondary | ICD-10-CM

## 2021-12-07 NOTE — Therapy (Signed)
OUTPATIENT PHYSICAL THERAPY TREATMENT   Patient Name: Jamie Weaver MRN: [id number] DOB:14-Dec-1990, 31 y.o., female Today's Date: 12/07/2021  PCP: Michela Pitcher, NP  REFERRING PROVIDER: Elba Barman, DO  END OF SESSION:   PT End of Session - 12/07/21 1142     Visit Number 3    Number of Visits 12    PT Start Time 8413    PT Stop Time 2440    PT Time Calculation (min) 35 min    Activity Tolerance Patient tolerated treatment well    Behavior During Therapy [facility] for tasks assessed/performed               Past Medical History:  Diagnosis Date   Lymphocoele    Right Inguinal lymphadenopathy 12/27/2017   Past Surgical History:  Procedure Laterality Date   ANKLE ARTHROSCOPY Left 02/01/2017   Procedure: ANKLE ARTHROSCOPY WITH DEBRIDMENT;  Surgeon: Corky Mull, MD;  Location: [facility];  Service: Orthopedics;  Laterality: Left;   INCISION AND DRAINAGE ABSCESS Right 02/20/2018   Procedure: DRAINAGE AND OVERSEWING OF RIGHT INGUINAL LYMPHOCOELE;  Surgeon: Vickie Epley, MD;  Location: [facility];  Service: General;  Laterality: Right;   INGUINAL LYMPH NODE BIOPSY Right 01/04/2018   Procedure: INGUINAL LYMPH NODE BIOPSY;  Surgeon: Vickie Epley, MD;  Location: [facility];  Service: General;  Laterality: Right;   Patient Active Problem List   Diagnosis Date Noted   High risk heterosexual behavior 03/03/2021   Preventative health care 02/10/2021   Vaginal discharge 02/10/2021   Chronic pain of left knee 02/10/2021   Anxiety 02/10/2021   Establishing care with new doctor, encounter for 09/07/2020   Iron deficiency anemia 09/07/2020   IUD contraception 09/07/2020   Synovitis of left ankle 01/01/2017    REFERRING DIAG: M25.562,G89.29 (ICD-10-CM) - Chronic pain of left knee   THERAPY DIAG:  Localized edema  Muscle weakness (generalized)  Chronic pain of left knee  Difficulty in walking, not elsewhere classified  Rationale for Evaluation and Treatment  Rehabilitation  ONSET DATE: July of 2022  SUBJECTIVE:   SUBJECTIVE STATEMENT: Doesn't do isometric seated hamstring exercise.  Not wearing brace as much, does notice more discomfort with strengthening.  PERTINENT HISTORY: Previous Lt ankle scope, chronic left knee pain, previous Lt ankle synovitis  PAIN:  NPRS scale: no pain at rest upon arrival.  Pain location: Medial and posterior L knee Pain description: Dull, full, edema like feeling Aggravating factors: Running, cutting, pushing it with biking or any gym activity Relieving factors: Hold off heavier physical activities  PRECAUTIONS: Other: Avoid overuse!  WEIGHT BEARING RESTRICTIONS No  FALLS:  Has patient fallen in last 6 months? No  LIVING ENVIRONMENT: Lives with: lives with their family and children Lives in: House/apartment Stairs:  [place] with stairs other than overuse Has following equipment at home: None  OCCUPATION: PA in Cardiology  PLOF: Independent  PATIENT GOALS Get her back into normal activities without being stopped/limited by the L knee   OBJECTIVE:   DIAGNOSTIC FINDINGS: IMPRESSION: 1. Small knee joint effusion. 2. Thin medial plica.  PATIENT SURVEYS:  11/17/2021 FOTO 76 (Goal 87)  COGNITION: 11/17/2021  Overall cognitive status: Within functional limits for tasks assessed     SENSATION: 11/17/2021 No complaints of tingling or paresthesias  MUSCLE LENGTH: 11/17/2021 Hamstrings: Right 60 deg; Left 70 deg Quadriceps: Right 115 deg; Left 120 deg  PALPATION: 11/17/2021 Some medial hamstrings tenderness.  Consistent with subjective site of pain.  LOWER EXTREMITY ROM:  Active  ROM Right 11/17/2021 Left 11/17/2021  Hip flexion    Hip extension    Hip abduction    Hip adduction    Hip internal rotation    Hip external rotation    Knee flexion 145 143  Knee extension 0 0  Ankle dorsiflexion    Ankle plantarflexion    Ankle inversion    Ankle eversion     (Blank rows = not  tested)  LOWER EXTREMITY Strength: Thigh Girth assessed 15 cm proximal to the superior patellar pole Lt/Rt: 52.0 cm/52.5 cm  Strength in pounds assessed by hand-held dynamometer Right 11/17/2021 Left 11/17/2021 Left 11/30/2021 Left 12/07/21  Hip flexion      Hip extension      Hip abduction      Hip adduction      Hip internal rotation      Hip external rotation      Knee flexion 46.2 42.6 5/5 c pain mild (testing in prone) 61.15#  Knee extension 98.6 93..5    Ankle dorsiflexion      Ankle plantarflexion      Ankle inversion      Ankle eversion       (Blank rows = not tested)  LOWER EXTREMITY SPECIAL TESTS:  11/17/2021 Pain with resisted hamstrings MMT on the left   TODAY'S TREATMENT: 12/07/21 Manual: STM with compression to Lt medial hamstring; skilled palpation and monitoring of soft tissue during DN  Therex Isometric hamstring hold 3x15 sec each on machine with 25# Hamstring dynamometry testing as noted above  Trigger Point Dry-Needling  Treatment instructions: Expect mild to moderate muscle soreness. S/S of pneumothorax if dry needled over a lung field, and to seek immediate medical attention should they occur. Patient verbalized understanding of these instructions and education.  Patient Consent Given: Yes Education handout provided: No (verbally reviewed and pt has had DN before) Muscles treated: Lt medial hamstring (4 needles) Electrical stimulation performed: Yes Parameters:  to tolerance x 5 min; 2 channels; 10 mA freq Treatment response/outcome: twitch responses noted   11/30/2021: Therex: Verbal review of existing HEP.  Cues for frequency and consistency. Supine leg outstretched hip extension isometrics 10 sec x 5 (cues for use at home, building to 30 seconds as appropraite) Supine single leg figure 4 bridge x 5 (cues for various positioning of foot to change loading Standing single leg romanian deadlift x 10 Lt unweighted.   11/17/2021 Hamstrings  Isometrics Seated and Supine 5X 5 seconds each Hamstrings curls (on machine): Pull back with Both legs and slow eccentrics with Left only 5X with 15# and 5X with 10#   PATIENT EDUCATION:  11/17/2021 Education details: Reviewed exam findings and HEP Person educated: Patient Education method: Explanation, Demonstration, Tactile cues, Verbal cues, and Handouts Education comprehension: verbalized understanding, returned demonstration, verbal cues required, tactile cues required, and needs further education   HOME EXERCISE PROGRAM: Access Code: [email or web address] URL: [email or web address] Date: 11/17/2021 Prepared by: Vista Mink  Exercises - Seated Hamstring Set  - 3-5 x daily - 7 x weekly - 1 sets - 10 reps - 5 seconds hold  ASSESSMENT:  CLINICAL IMPRESSION: Improvement in hamstring strength noted today compared to initial eval.  Trial of DN with estim today to see if that helps with pain.  Discussed also weaning from brace as able.  Will continue to benefit from PT to maximize function.  OBJECTIVE IMPAIRMENTS Abnormal gait, decreased activity tolerance, decreased endurance, decreased knowledge of condition, difficulty walking, decreased strength, increased edema, impaired perceived functional ability,  and pain.   ACTIVITY LIMITATIONS stairs, locomotion level, and workout limitations  PARTICIPATION LIMITATIONS:  She is not as physically active nor does she have the endurance with workouts and lifestyle activities as she previously did  PERSONAL FACTORS No personal factors are also affecting patient's functional outcome.   REHAB POTENTIAL: Good  CLINICAL DECISION MAKING: Stable/uncomplicated  EVALUATION COMPLEXITY: Low   GOALS: Goals reviewed with patient? Yes  SHORT TERM GOALS: Target date: 12/15/2021 Improve L hamstrings strength to > 50 pounds Baseline: 42.6 pounds Goal status: MET 12/07/21   LONG TERM GOALS: Target date: 02/09/2022  Improve FOTO to  87 Baseline: 76 Goal status: INITIAL  2.  Improve posterior left knee pain to consistently 0-2/10 on the VAS Baseline: Can be 6/10 Goal status: INITIAL  3.  Improve B hamstrings strength to at least 60 pounds Baseline: See objective Goal status: Partially met 12/07/21  4.  Tashe will be able to run 4 miles, do 25+ minutes of stairs and gradually increase her gym program without being limited by L knee pain. Baseline: 3 miles of running, 20 minutes of stairs and limited in gym activities Goal status: INITIAL  5.  Kanyon will be independent with her long-term HEP at [place] Baseline: Started 11/17/2021 Goal status: INITIAL   PLAN: PT FREQUENCY: 1x/week or every other week (work schedule)  PT DURATION: 12 weeks  PLANNED INTERVENTIONS: Therapeutic exercises, Therapeutic activity, Neuromuscular re-education, Balance training, Gait training, Patient/Family education, Self Care, Stair training, Dry Needling, Electrical stimulation, Cryotherapy, and Vasopneumatic device  PLAN FOR NEXT SESSION:Check dynamometry testing.  Dynamic stability. Assess response to DN   Laureen Abrahams, PT, DPT 12/07/21 12:43 PM

## 2021-12-20 ENCOUNTER — Encounter: Payer: Self-pay | Admitting: Physical Therapy

## 2021-12-20 ENCOUNTER — Ambulatory Visit (INDEPENDENT_AMBULATORY_CARE_PROVIDER_SITE_OTHER): Payer: No Typology Code available for payment source | Admitting: Physical Therapy

## 2021-12-20 DIAGNOSIS — M25562 Pain in left knee: Secondary | ICD-10-CM

## 2021-12-20 DIAGNOSIS — M6281 Muscle weakness (generalized): Secondary | ICD-10-CM | POA: Diagnosis not present

## 2021-12-20 DIAGNOSIS — G8929 Other chronic pain: Secondary | ICD-10-CM | POA: Diagnosis not present

## 2021-12-20 DIAGNOSIS — R262 Difficulty in walking, not elsewhere classified: Secondary | ICD-10-CM

## 2021-12-20 NOTE — Therapy (Addendum)
OUTPATIENT PHYSICAL THERAPY TREATMENT/DISCHARGE   Patient Name: Jamie Weaver MRN: [id number] DOB:03-11-1990, 31 y.o., female Today's Date: 12/20/2021  PHYSICAL THERAPY DISCHARGE SUMMARY  Visits from Start of Care: 4  Current functional level related to goals / functional outcomes: See note   Remaining deficits: See note   Education / Equipment: HEP   Patient agrees to discharge. Patient goals were partially met. Patient is being discharged due to being pleased with the current functional level.   PCP: Michela Pitcher, NP  REFERRING PROVIDER: Elba Barman, DO  END OF SESSION:   PT End of Session - 12/20/21 1214     Visit Number 4    Number of Visits 12    PT Start Time 4098    PT Stop Time 1209    PT Time Calculation (min) 22 min    Activity Tolerance Patient tolerated treatment well    Behavior During Therapy [facility] for tasks assessed/performed                Past Medical History:  Diagnosis Date   Lymphocoele    Right Inguinal lymphadenopathy 12/27/2017   Past Surgical History:  Procedure Laterality Date   ANKLE ARTHROSCOPY Left 02/01/2017   Procedure: ANKLE ARTHROSCOPY WITH DEBRIDMENT;  Surgeon: Corky Mull, MD;  Location: [facility];  Service: Orthopedics;  Laterality: Left;   INCISION AND DRAINAGE ABSCESS Right 02/20/2018   Procedure: DRAINAGE AND OVERSEWING OF RIGHT INGUINAL LYMPHOCOELE;  Surgeon: Vickie Epley, MD;  Location: [facility];  Service: General;  Laterality: Right;   INGUINAL LYMPH NODE BIOPSY Right 01/04/2018   Procedure: INGUINAL LYMPH NODE BIOPSY;  Surgeon: Vickie Epley, MD;  Location: [facility];  Service: General;  Laterality: Right;   Patient Active Problem List   Diagnosis Date Noted   High risk heterosexual behavior 03/03/2021   Preventative health care 02/10/2021   Vaginal discharge 02/10/2021   Chronic pain of left knee 02/10/2021   Anxiety 02/10/2021   Establishing care with new doctor, encounter for 09/07/2020   Iron  deficiency anemia 09/07/2020   IUD contraception 09/07/2020   Synovitis of left ankle 01/01/2017    REFERRING DIAG: M25.562,G89.29 (ICD-10-CM) - Chronic pain of left knee   THERAPY DIAG:  Chronic pain of left knee  Difficulty in walking, not elsewhere classified  Muscle weakness (generalized)  Rationale for Evaluation and Treatment Rehabilitation  ONSET DATE: July of 2022  SUBJECTIVE:   SUBJECTIVE STATEMENT: Played soccer on Sunday, and no real issues with her knee, did injure her ankle a little bit but feels that will improve.  Able to do gym lower body about every other day at this time.  PERTINENT HISTORY: Previous Lt ankle scope, chronic left knee pain, previous Lt ankle synovitis  PAIN:  NPRS scale: no pain at rest upon arrival.  Pain location: Medial and posterior L knee Pain description: Dull, full, edema like feeling Aggravating factors: Running, cutting, pushing it with biking or any gym activity Relieving factors: Hold off heavier physical activities  PRECAUTIONS: Other: Avoid overuse!  WEIGHT BEARING RESTRICTIONS No  FALLS:  Has patient fallen in last 6 months? No  LIVING ENVIRONMENT: Lives with: lives with their family and children Lives in: House/apartment Stairs:  [place] with stairs other than overuse Has following equipment at home: None  OCCUPATION: PA in Cardiology  PLOF: Independent  PATIENT GOALS Get her back into normal activities without being stopped/limited by the L knee   OBJECTIVE:   PATIENT SURVEYS:  11/17/2021 FOTO 63 (  Goal 87)  COGNITION: 11/17/2021  Overall cognitive status: Within functional limits for tasks assessed     SENSATION: 11/17/2021 No complaints of tingling or paresthesias  MUSCLE LENGTH: 11/17/2021 Hamstrings: Right 60 deg; Left 70 deg Quadriceps: Right 115 deg; Left 120 deg  PALPATION: 11/17/2021 Some medial hamstrings tenderness.  Consistent with subjective site of pain.  LOWER EXTREMITY ROM:  Active  ROM Right 11/17/2021 Left 11/17/2021  Hip flexion    Hip extension    Hip abduction    Hip adduction    Hip internal rotation    Hip external rotation    Knee flexion 145 143  Knee extension 0 0  Ankle dorsiflexion    Ankle plantarflexion    Ankle inversion    Ankle eversion     (Blank rows = not tested)  LOWER EXTREMITY Strength: Thigh Girth assessed 15 cm proximal to the superior patellar pole Lt/Rt: 52.0 cm/52.5 cm  Strength in pounds assessed by hand-held dynamometer Right 11/17/2021 Left 11/17/2021 Left 11/30/2021 Left 12/07/21  Hip flexion      Hip extension      Hip abduction      Hip adduction      Hip internal rotation      Hip external rotation      Knee flexion 46.2 42.6 5/5 c pain mild (testing in prone) 61.15#  Knee extension 98.6 93..5    Ankle dorsiflexion      Ankle plantarflexion      Ankle inversion      Ankle eversion       (Blank rows = not tested)  LOWER EXTREMITY SPECIAL TESTS:  11/17/2021 Pain with resisted hamstrings MMT on the left   TODAY'S TREATMENT: 12/20/21 Manual: STM with compression to Lt medial hamstring; skilled palpation and monitoring of soft tissue during DN  Trigger Point Dry-Needling  Treatment instructions: Expect mild to moderate muscle soreness. S/S of pneumothorax if dry needled over a lung field, and to seek immediate medical attention should they occur. Patient verbalized understanding of these instructions and education.  Patient Consent Given: Yes Education handout provided: No (verbally reviewed and pt has had DN before) Muscles treated: Lt medial hamstring (4 needles) Electrical stimulation performed: Yes Parameters: to tolerance x 5 min; 2 channels; 10 mA freq Treatment response/outcome: twitch responses noted  12/07/21 Manual: STM with compression to Lt medial hamstring; skilled palpation and monitoring of soft tissue during DN  Therex Isometric hamstring hold 3x15 sec each on machine with 25# Hamstring  dynamometry testing as noted above  Trigger Point Dry-Needling  Treatment instructions: Expect mild to moderate muscle soreness. S/S of pneumothorax if dry needled over a lung field, and to seek immediate medical attention should they occur. Patient verbalized understanding of these instructions and education.  Patient Consent Given: Yes Education handout provided: No (verbally reviewed and pt has had DN before) Muscles treated: Lt medial hamstring (4 needles) Electrical stimulation performed: Yes Parameters:  to tolerance x 5 min; 2 channels; 10 mA freq Treatment response/outcome: twitch responses noted   11/30/2021: Therex: Verbal review of existing HEP.  Cues for frequency and consistency. Supine leg outstretched hip extension isometrics 10 sec x 5 (cues for use at home, building to 30 seconds as appropraite) Supine single leg figure 4 bridge x 5 (cues for various positioning of foot to change loading Standing single leg romanian deadlift x 10 Lt unweighted.   11/17/2021 Hamstrings Isometrics Seated and Supine 5X 5 seconds each Hamstrings curls (on machine): Pull back with Both  legs and slow eccentrics with Left only 5X with 15# and 5X with 10#   PATIENT EDUCATION:  11/17/2021 Education details: Reviewed exam findings and HEP Person educated: Patient Education method: Explanation, Demonstration, Tactile cues, Verbal cues, and Handouts Education comprehension: verbalized understanding, returned demonstration, verbal cues required, tactile cues required, and needs further education   HOME EXERCISE PROGRAM: Access Code: [email or web address] URL: [email or web address] Date: 11/17/2021 Prepared by: Vista Mink  Exercises - Seated Hamstring Set  - 3-5 x daily - 7 x weekly - 1 sets - 10 reps - 5 seconds hold  ASSESSMENT:  CLINICAL IMPRESSION: Positive response to DN with estim last visit and pt able to play soccer without difficulty this week.  Anticipate she's nearing  d/c from PT.     OBJECTIVE IMPAIRMENTS Abnormal gait, decreased activity tolerance, decreased endurance, decreased knowledge of condition, difficulty walking, decreased strength, increased edema, impaired perceived functional ability, and pain.   ACTIVITY LIMITATIONS stairs, locomotion level, and workout limitations  PARTICIPATION LIMITATIONS:  She is not as physically active nor does she have the endurance with workouts and lifestyle activities as she previously did  PERSONAL FACTORS No personal factors are also affecting patient's functional outcome.   REHAB POTENTIAL: Good  CLINICAL DECISION MAKING: Stable/uncomplicated  EVALUATION COMPLEXITY: Low   GOALS: Goals reviewed with patient? Yes  SHORT TERM GOALS: Target date: 12/15/2021 Improve L hamstrings strength to > 50 pounds Baseline: 42.6 pounds Goal status: MET 12/07/21   LONG TERM GOALS: Target date: 02/09/2022  Improve FOTO to 87 Baseline: 76 Goal status: INITIAL  2.  Improve posterior left knee pain to consistently 0-2/10 on the VAS Baseline: Can be 6/10 Goal status: MET 12/20/21  3.  Improve B hamstrings strength to at least 60 pounds Baseline: See objective Goal status: Partially met 12/07/21  4.  Analysa will be able to run 4 miles, do 25+ minutes of stairs and gradually increase her gym program without being limited by L knee pain. Baseline: 3 miles of running, 20 minutes of stairs and limited in gym activities Goal status: INITIAL  5.  Maral will be independent with her long-term HEP at [place] Baseline: Started 11/17/2021 Goal status: INITIAL   PLAN: PT FREQUENCY: 1x/week or every other week (work schedule)  PT DURATION: 12 weeks  PLANNED INTERVENTIONS: Therapeutic exercises, Therapeutic activity, Neuromuscular re-education, Balance training, Gait training, Patient/Family education, Self Care, Stair training, Dry Needling, Electrical stimulation, Cryotherapy, and Vasopneumatic device  PLAN FOR NEXT  SESSION: possible d/c,  Check dynamometry testing.  Dynamic stability. Assess response to DN   Laureen Abrahams, PT, DPT 12/20/21 12:18 PM  Farley Ly PT, MPT

## 2022-01-02 ENCOUNTER — Encounter: Payer: No Typology Code available for payment source | Admitting: Physical Therapy

## 2022-01-02 NOTE — Therapy (Incomplete)
OUTPATIENT PHYSICAL THERAPY TREATMENT   Patient Name: Jamie Weaver MRN: [id number] DOB:1990/07/17, 31 y.o., female Today's Date: 01/02/2022  PCP: Michela Pitcher, NP  REFERRING PROVIDER: Elba Barman, DO  END OF SESSION:        Past Medical History:  Diagnosis Date   Lymphocoele    Right Inguinal lymphadenopathy 12/27/2017   Past Surgical History:  Procedure Laterality Date   ANKLE ARTHROSCOPY Left 02/01/2017   Procedure: ANKLE ARTHROSCOPY WITH DEBRIDMENT;  Surgeon: Corky Mull, MD;  Location: [facility];  Service: Orthopedics;  Laterality: Left;   INCISION AND DRAINAGE ABSCESS Right 02/20/2018   Procedure: DRAINAGE AND OVERSEWING OF RIGHT INGUINAL LYMPHOCOELE;  Surgeon: Vickie Epley, MD;  Location: [facility];  Service: General;  Laterality: Right;   INGUINAL LYMPH NODE BIOPSY Right 01/04/2018   Procedure: INGUINAL LYMPH NODE BIOPSY;  Surgeon: Vickie Epley, MD;  Location: [facility];  Service: General;  Laterality: Right;   Patient Active Problem List   Diagnosis Date Noted   High risk heterosexual behavior 03/03/2021   Preventative health care 02/10/2021   Vaginal discharge 02/10/2021   Chronic pain of left knee 02/10/2021   Anxiety 02/10/2021   Establishing care with new doctor, encounter for 09/07/2020   Iron deficiency anemia 09/07/2020   IUD contraception 09/07/2020   Synovitis of left ankle 01/01/2017    REFERRING DIAG: M25.562,G89.29 (ICD-10-CM) - Chronic pain of left knee   THERAPY DIAG:  No diagnosis found.  Rationale for Evaluation and Treatment Rehabilitation  ONSET DATE: July of 2022  SUBJECTIVE:   SUBJECTIVE STATEMENT: *** Played soccer on Sunday, and no real issues with her knee, did injure her ankle a little bit but feels that will improve.  Able to do gym lower body about every other day at this time.  PERTINENT HISTORY: Previous Lt ankle scope, chronic left knee pain, previous Lt ankle synovitis  PAIN:  NPRS scale: no pain at  rest upon arrival.  Pain location: Medial and posterior L knee Pain description: Dull, full, edema like feeling Aggravating factors: Running, cutting, pushing it with biking or any gym activity Relieving factors: Hold off heavier physical activities  PRECAUTIONS: Other: Avoid overuse!  WEIGHT BEARING RESTRICTIONS No  FALLS:  Has patient fallen in last 6 months? No  LIVING ENVIRONMENT: Lives with: lives with their family and children Lives in: House/apartment Stairs:  Ok with stairs other than overuse Has following equipment at home: None  OCCUPATION: PA in Cardiology  PLOF: Independent  PATIENT GOALS Get her back into normal activities without being stopped/limited by the L knee   OBJECTIVE:   PATIENT SURVEYS:  11/17/2021 FOTO 76 (Goal 87)  COGNITION: 11/17/2021  Overall cognitive status: Within functional limits for tasks assessed     SENSATION: 11/17/2021 No complaints of tingling or paresthesias  MUSCLE LENGTH: 11/17/2021 Hamstrings: Right 60 deg; Left 70 deg Quadriceps: Right 115 deg; Left 120 deg  PALPATION: 11/17/2021 Some medial hamstrings tenderness.  Consistent with subjective site of pain.  LOWER EXTREMITY ROM:  Active ROM Right 11/17/2021 Left 11/17/2021  Hip flexion    Hip extension    Hip abduction    Hip adduction    Hip internal rotation    Hip external rotation    Knee flexion 145 143  Knee extension 0 0  Ankle dorsiflexion    Ankle plantarflexion    Ankle inversion    Ankle eversion     (Blank rows = not tested)  LOWER EXTREMITY Strength: Thigh Girth assessed  15 cm proximal to the superior patellar pole Lt/Rt: 52.0 cm/52.5 cm  Strength in pounds assessed by hand-held dynamometer Right 11/17/2021 Left 11/17/2021 Left 11/30/2021 Left 12/07/21  Hip flexion      Hip extension      Hip abduction      Hip adduction      Hip internal rotation      Hip external rotation      Knee flexion 46.2 42.6 5/5 c pain mild (testing in  prone) 61.15#  Knee extension 98.6 93..5    Ankle dorsiflexion      Ankle plantarflexion      Ankle inversion      Ankle eversion       (Blank rows = not tested)  LOWER EXTREMITY SPECIAL TESTS:  11/17/2021 Pain with resisted hamstrings MMT on the left   TODAY'S TREATMENT: 01/02/22 ***  12/20/21 Manual: STM with compression to Lt medial hamstring; skilled palpation and monitoring of soft tissue during DN  Trigger Point Dry-Needling  Treatment instructions: Expect mild to moderate muscle soreness. S/S of pneumothorax if dry needled over a lung field, and to seek immediate medical attention should they occur. Patient verbalized understanding of these instructions and education.  Patient Consent Given: Yes Education handout provided: No (verbally reviewed and pt has had DN before) Muscles treated: Lt medial hamstring (4 needles) Electrical stimulation performed: Yes Parameters: to tolerance x 5 min; 2 channels; 10 mA freq Treatment response/outcome: twitch responses noted  12/07/21 Manual: STM with compression to Lt medial hamstring; skilled palpation and monitoring of soft tissue during DN  Therex Isometric hamstring hold 3x15 sec each on machine with 25# Hamstring dynamometry testing as noted above  Trigger Point Dry-Needling  Treatment instructions: Expect mild to moderate muscle soreness. S/S of pneumothorax if dry needled over a lung field, and to seek immediate medical attention should they occur. Patient verbalized understanding of these instructions and education.  Patient Consent Given: Yes Education handout provided: No (verbally reviewed and pt has had DN before) Muscles treated: Lt medial hamstring (4 needles) Electrical stimulation performed: Yes Parameters:  to tolerance x 5 min; 2 channels; 10 mA freq Treatment response/outcome: twitch responses noted   11/30/2021: Therex: Verbal review of existing HEP.  Cues for frequency and consistency. Supine leg  outstretched hip extension isometrics 10 sec x 5 (cues for use at home, building to 30 seconds as appropraite) Supine single leg figure 4 bridge x 5 (cues for various positioning of foot to change loading Standing single leg romanian deadlift x 10 Lt unweighted.   11/17/2021 Hamstrings Isometrics Seated and Supine 5X 5 seconds each Hamstrings curls (on machine): Pull back with Both legs and slow eccentrics with Left only 5X with 15# and 5X with 10#   PATIENT EDUCATION:  11/17/2021 Education details: Reviewed exam findings and HEP Person educated: Patient Education method: Explanation, Demonstration, Tactile cues, Verbal cues, and Handouts Education comprehension: verbalized understanding, returned demonstration, verbal cues required, tactile cues required, and needs further education   HOME EXERCISE PROGRAM: Access Code: [email or web address] URL: [email or web address] Date: 11/17/2021 Prepared by: Vista Mink  Exercises - Seated Hamstring Set  - 3-5 x daily - 7 x weekly - 1 sets - 10 reps - 5 seconds hold  ASSESSMENT:  CLINICAL IMPRESSION: Positive response to DN with estim last visit and pt able to play soccer without difficulty this week.  Anticipate she's nearing d/c from PT.     OBJECTIVE IMPAIRMENTS Abnormal gait, decreased activity tolerance, decreased endurance, decreased knowledge of  condition, difficulty walking, decreased strength, increased edema, impaired perceived functional ability, and pain.   ACTIVITY LIMITATIONS stairs, locomotion level, and workout limitations  PARTICIPATION LIMITATIONS:  She is not as physically active nor does she have the endurance with workouts and lifestyle activities as she previously did  PERSONAL FACTORS No personal factors are also affecting patient's functional outcome.   REHAB POTENTIAL: Good  CLINICAL DECISION MAKING: Stable/uncomplicated  EVALUATION COMPLEXITY: Low   GOALS: Goals reviewed with patient? Yes  SHORT  TERM GOALS: Target date: 12/15/2021 Improve L hamstrings strength to > 50 pounds Baseline: 42.6 pounds Goal status: MET 12/07/21   LONG TERM GOALS: Target date: 02/09/2022  Improve FOTO to 87 Baseline: 76 Goal status: INITIAL  2.  Improve posterior left knee pain to consistently 0-2/10 on the VAS Baseline: Can be 6/10 Goal status: MET 12/20/21  3.  Improve B hamstrings strength to at least 60 pounds Baseline: See objective Goal status: Partially met 12/07/21  4.  Zahira will be able to run 4 miles, do 25+ minutes of stairs and gradually increase her gym program without being limited by L knee pain. Baseline: 3 miles of running, 20 minutes of stairs and limited in gym activities Goal status: INITIAL  5.  Ebonie will be independent with her long-term HEP at DC Baseline: Started 11/17/2021 Goal status: INITIAL   PLAN: PT FREQUENCY: 1x/week or every other week (work schedule)  PT DURATION: 12 weeks  PLANNED INTERVENTIONS: Therapeutic exercises, Therapeutic activity, Neuromuscular re-education, Balance training, Gait training, Patient/Family education, Self Care, Stair training, Dry Needling, Electrical stimulation, Cryotherapy, and Vasopneumatic device  PLAN FOR NEXT SESSION: *** possible d/c,  Check dynamometry testing.  Dynamic stability. Assess response to DN   Laureen Abrahams, PT, DPT 01/02/22 7:53 AM

## 2022-03-17 ENCOUNTER — Encounter: Payer: Self-pay | Admitting: Nurse Practitioner

## 2022-03-17 ENCOUNTER — Other Ambulatory Visit: Payer: Self-pay | Admitting: Nurse Practitioner

## 2022-03-17 DIAGNOSIS — F419 Anxiety disorder, unspecified: Secondary | ICD-10-CM

## 2022-03-17 MED ORDER — HYDROXYZINE PAMOATE 25 MG PO CAPS: 25.0000 mg | ORAL_CAPSULE | Freq: Two times a day (BID) | ORAL | 0 refills | Status: AC | PRN

## 2022-03-17 NOTE — Addendum Note (Signed)
Addended by: Michela Pitcher on: 03/17/2022 04:57 PM   Modules accepted: Orders

## 2022-03-17 NOTE — Telephone Encounter (Signed)
Pt scheduled OV 03/22/22  FYI

## 2022-03-22 ENCOUNTER — Encounter: Payer: Self-pay | Admitting: Nurse Practitioner

## 2022-03-22 ENCOUNTER — Ambulatory Visit (INDEPENDENT_AMBULATORY_CARE_PROVIDER_SITE_OTHER): Payer: 59 | Admitting: Nurse Practitioner

## 2022-03-22 VITALS — BP 96/60 | HR 73 | Temp 98.0°F | Resp 16 | Ht 67.0 in | Wt 149.1 lb

## 2022-03-22 DIAGNOSIS — F411 Generalized anxiety disorder: Secondary | ICD-10-CM | POA: Diagnosis not present

## 2022-03-22 MED ORDER — ESCITALOPRAM OXALATE 10 MG PO TABS: ORAL_TABLET | ORAL | 0 refills | Status: DC

## 2022-03-22 NOTE — Assessment & Plan Note (Signed)
Will start patient on Lexapro 10 mg start with 5 mg daily for 2 weeks then titrate to 10 mg daily.  She continues on hydroxyzine 25 mg as needed.  If she continues having dizziness or becomes overwhelming we will titrate hydroxyzine to 10 mg versus 25.

## 2022-03-22 NOTE — Patient Instructions (Signed)
Nice to see you today You can continue using the hydroxyzine as needed We will start lexapro. Follow up in 6-8 weeks with me and we will do a physical and labs at that time

## 2022-03-22 NOTE — Progress Notes (Signed)
   Established Patient Office Visit  Subjective   Patient ID: Jamie Weaver, female    DOB: 23-Aug-1990  Age: 32 y.o. MRN: [id number]  Chief Complaint  Patient presents with   New Med Request    HPI  Anxiety: States that she has been using hydroxyzine. States that she si exercsiing and doing couseling. States that she is feeling anxious on a daily basis. Statesa that she cannot inpoint a cause. States that she is ok in regatds to her sleeping. States that it is throughout the day. States that the hydroxzyine does help but makes her feel dizzy. States that will last approx 3 minutes.      Review of Systems  Constitutional:  Negative for chills and fever.  Respiratory:  Negative for shortness of breath.   Cardiovascular:  Negative for chest pain.  Psychiatric/Behavioral:  Negative for hallucinations and suicidal ideas. The patient is nervous/anxious. The patient does not have insomnia.       Objective:     BP 96/60   Pulse 73   Temp 98 F (36.7 C)   Resp 16   Ht 5\' 7"  (1.702 m)   Wt 149 lb 2 oz (67.6 kg)   SpO2 97%   BMI 23.36 kg/m    Physical Exam Vitals and nursing note reviewed.  Constitutional:      Appearance: Normal appearance.  Cardiovascular:     Rate and Rhythm: Normal rate and regular rhythm.     Heart sounds: Normal heart sounds.  Pulmonary:     Effort: Pulmonary effort is normal.     Breath sounds: Normal breath sounds.  Neurological:     Mental Status: She is alert.      No results found for any visits on 03/22/22.    The ASCVD Risk score (Arnett DK, et al., 2019) failed to calculate for the following reasons:   The 2019 ASCVD risk score is only valid for ages 80 to 75    Assessment & Plan:   Problem List Items Addressed This Visit       Other   GAD (generalized anxiety disorder) - Primary    Will start patient on Lexapro 10 mg start with 5 mg daily for 2 weeks then titrate to 10 mg daily.  She continues on hydroxyzine 25 mg as needed.   If she continues having dizziness or becomes overwhelming we will titrate hydroxyzine to 10 mg versus 25.      Relevant Medications   escitalopram (LEXAPRO) 10 MG tablet    Return in about 7 weeks (around 05/10/2022) for CPE and Labs/ GAD.    Romilda Garret, NP

## 2022-04-15 ENCOUNTER — Other Ambulatory Visit: Payer: Self-pay | Admitting: Nurse Practitioner

## 2022-04-15 DIAGNOSIS — F411 Generalized anxiety disorder: Secondary | ICD-10-CM

## 2022-05-17 ENCOUNTER — Other Ambulatory Visit: Payer: Self-pay

## 2022-05-17 ENCOUNTER — Telehealth: Payer: Self-pay | Admitting: Nurse Practitioner

## 2022-05-17 DIAGNOSIS — F411 Generalized anxiety disorder: Secondary | ICD-10-CM

## 2022-05-17 NOTE — Telephone Encounter (Signed)
Lexapro Last visit 03/22/2022 Next visit 05/18/2022 Called pharmacy and she picked up a 30 day supply on 04/20/2022 and they told her it would be cheaper at [place].

## 2022-05-17 NOTE — Telephone Encounter (Signed)
RX sent to provider

## 2022-05-17 NOTE — Telephone Encounter (Signed)
Prescription Request  05/17/2022  LOV: 03/22/2022  What is the name of the medication or equipment? escitalopram (LEXAPRO) 10 MG tablet  Have you contacted your pharmacy to request a refill? No   Which pharmacy would you like this sent to?   [facility] [place] [address] Phone: [phone] Fax: [phone]    Patient notified that their request is being sent to the clinical staff for review and that they should receive a response within 2 business days.   Please advise at [place]

## 2022-05-18 ENCOUNTER — Other Ambulatory Visit (HOSPITAL_COMMUNITY)
Admission: RE | Admit: 2022-05-18 | Discharge: 2022-05-18 | Disposition: A | Discharge status: Home / Self Care | Payer: 59 | Source: Ambulatory Visit | Attending: Nurse Practitioner | Admitting: Nurse Practitioner

## 2022-05-18 ENCOUNTER — Other Ambulatory Visit: Payer: Self-pay

## 2022-05-18 ENCOUNTER — Encounter: Payer: Self-pay | Admitting: Nurse Practitioner

## 2022-05-18 ENCOUNTER — Ambulatory Visit (INDEPENDENT_AMBULATORY_CARE_PROVIDER_SITE_OTHER): Payer: 59 | Admitting: Nurse Practitioner

## 2022-05-18 VITALS — BP 112/66 | HR 63 | Temp 97.9°F | Resp 16 | Ht 67.5 in | Wt 145.5 lb

## 2022-05-18 DIAGNOSIS — Z113 Encounter for screening for infections with a predominantly sexual mode of transmission: Secondary | ICD-10-CM | POA: Insufficient documentation

## 2022-05-18 DIAGNOSIS — Z0000 Encounter for general adult medical examination without abnormal findings: Secondary | ICD-10-CM

## 2022-05-18 DIAGNOSIS — Z13220 Encounter for screening for lipoid disorders: Secondary | ICD-10-CM

## 2022-05-18 DIAGNOSIS — F411 Generalized anxiety disorder: Secondary | ICD-10-CM

## 2022-05-18 DIAGNOSIS — D509 Iron deficiency anemia, unspecified: Secondary | ICD-10-CM

## 2022-05-18 MED ORDER — ESCITALOPRAM OXALATE 10 MG PO TABS
10.0000 mg | ORAL_TABLET | Freq: Every day | ORAL | 3 refills | Status: DC
Start: 2022-05-18 — End: 2023-05-28
  Filled 2022-05-18: qty 90, 90d supply, fill #0

## 2022-05-18 NOTE — Assessment & Plan Note (Signed)
History of the same.  She was on iron supplementation and stop once her iron got to a normal level.  States that she started feeling poor again and went back on iron supplementation pending labs today includes of the above IBC close ferritin panel

## 2022-05-18 NOTE — Progress Notes (Signed)
Established Patient Office Visit  Subjective   Patient ID: Jamie Weaver, female    DOB: 12-24-1990  Age: 32 y.o. MRN: [id number]  Chief Complaint  Patient presents with   Annual Exam    HPI  for complete physical and follow up of chronic conditions.   GAD: Patient currently maintained patient currently maintained on Lexapro 10 mg.  Patient also has hydroxyzine as needed  IDA: Patient has a history of IDA and was replaced with oral iron.  Last iron was within normal limits   Immunizations: -Tetanus: Completed in 2020 -Influenza: Completed this season -Shingles: Too young -Pneumonia: Too young  Diet: Fair diet. States that she is doing 3 meals a day. No snacks. States that she will do coffee in then water Exercise: . 3-4 times a week with cardio and weights at an hour  Eye exam: Completes annually. Glasses.  Yearly   Dental exam: Needs updating    Colonoscopy: Too young, average risk Lung Cancer Screening: Does not follow 5  Pap smear: has a history of  abnormals with a history of hpv and came back and got a colpo and then went back to normal.  Patient did not require a LEEP procedure  Mammogram: Too young, currently average risk  Sleep: states she goes to bed aroun 1030 and get up around 6. Feels rested most time. Does not snore      Review of Systems  Constitutional:  Negative for chills and fever.  Respiratory:  Negative for shortness of breath.   Cardiovascular:  Negative for chest pain and leg swelling.  Gastrointestinal:  Negative for abdominal pain, blood in stool, constipation, diarrhea, nausea and vomiting.       Bm daily   Genitourinary:  Negative for dysuria and hematuria.  Neurological:  Negative for tingling and headaches.  Psychiatric/Behavioral:  Negative for hallucinations and suicidal ideas.       Objective:     BP 112/66   Pulse 63   Temp 97.9 F (36.6 C)   Resp 16   Ht 5' 7.5" (1.715 m)   Wt 145 lb 8 oz (66 kg)   SpO2 97%   BMI  22.45 kg/m  BP Readings from Last 3 Encounters:  05/18/22 112/66  03/22/22 96/60  03/14/21 94/60   Wt Readings from Last 3 Encounters:  05/18/22 145 lb 8 oz (66 kg)  03/22/22 149 lb 2 oz (67.6 kg)  11/09/21 145 lb (65.8 kg)      Physical Exam Vitals and nursing note reviewed.  Constitutional:      Appearance: Normal appearance.  HENT:     Right Ear: Tympanic membrane, ear canal and external ear normal.     Left Ear: Tympanic membrane, ear canal and external ear normal.     Mouth/Throat:     Mouth: Mucous membranes are moist.     Pharynx: Oropharynx is clear.  Eyes:     Extraocular Movements: Extraocular movements intact.     Pupils: Pupils are equal, round, and reactive to light.  Cardiovascular:     Rate and Rhythm: Normal rate and regular rhythm.     Pulses: Normal pulses.     Heart sounds: Normal heart sounds.  Pulmonary:     Effort: Pulmonary effort is normal.     Breath sounds: Normal breath sounds.  Abdominal:     General: Bowel sounds are normal. There is no distension.     Palpations: There is no mass.     Tenderness: There  is no abdominal tenderness.     Hernia: No hernia is present.  Musculoskeletal:     Right lower leg: No edema.     Left lower leg: No edema.  Lymphadenopathy:     Cervical: No cervical adenopathy.  Skin:    General: Skin is warm.  Neurological:     General: No focal deficit present.     Mental Status: She is alert.     Deep Tendon Reflexes:     Reflex Scores:      Bicep reflexes are 2+ on the right side and 2+ on the left side.      Patellar reflexes are 2+ on the right side and 2+ on the left side.    Comments: Bilateral upper and lower extremity strength 5/5  Psychiatric:        Mood and Affect: Mood normal.        Behavior: Behavior normal.        Thought Content: Thought content normal.        Judgment: Judgment normal.      No results found for any visits on 05/18/22.    The ASCVD Risk score (Arnett DK, et al., 2019)  failed to calculate for the following reasons:   The 2019 ASCVD risk score is only valid for ages 46 to 79    Assessment & Plan:   Problem List Items Addressed This Visit       Other   Iron deficiency anemia    History of the same.  She was on iron supplementation and stop once her iron got to a normal level.  States that she started feeling poor again and went back on iron supplementation pending labs today includes of the above IBC close ferritin panel      Relevant Orders   IBC + Ferritin   Preventative health care    Discussed age-appropriate immunizations and screening exams.  Did review patient's personal, surgical, family, social histories.  Patient up-to-date on her vaccinations.  Too young for CRC screening or breast cancer screening.  Patient up-to-date on her Pap smear will repeat in 2025.  Patient was given information at discharge about preventative healthcare maintenance with history of her age range.  She would like to be routinely tested for STIs orders placed today      Relevant Orders   CBC   Comprehensive metabolic panel   TSH   GAD (generalized anxiety disorder)    Patient currently maintained on escitalopram 10 mg daily.  Refill provided today.  Patient denies HI/SI/AVH.      Relevant Medications   escitalopram (LEXAPRO) 10 MG tablet   Other Relevant Orders   TSH   Other Visit Diagnoses     Routine screening for STI (sexually transmitted infection)    -  Primary   Relevant Orders   HIV Antibody (routine testing w rflx)   RPR   HSV(herpes simplex vrs) 1+2 ab-IgG   Urine cytology ancillary only   Screening for lipid disorders       Relevant Orders   Lipid panel       Return in about 1 year (around 05/18/2023) for CPE and Labs.    Romilda Garret, NP

## 2022-05-18 NOTE — Assessment & Plan Note (Signed)
Discussed age-appropriate immunizations and screening exams.  Did review patient's personal, surgical, family, social histories.  Patient up-to-date on her vaccinations.  Too young for CRC screening or breast cancer screening.  Patient up-to-date on her Pap smear will repeat in 2025.  Patient was given information at discharge about preventative healthcare maintenance with history of her age range.  She would like to be routinely tested for STIs orders placed today

## 2022-05-18 NOTE — Patient Instructions (Signed)
Nice to see you today I will be in touch with the labs once I have reviewed them Follow up with me in 1 year for your next physical and labs.

## 2022-05-18 NOTE — Assessment & Plan Note (Signed)
Patient currently maintained on escitalopram 10 mg daily.  Refill provided today.  Patient denies HI/SI/AVH.

## 2022-05-19 LAB — CBC
HCT: 38.6 % (ref 36.0–46.0)
Hemoglobin: 13.2 g/dL (ref 12.0–15.0)
MCHC: 34.3 g/dL (ref 30.0–36.0)
MCV: 93.7 fl (ref 78.0–100.0)
Platelets: 233 10*3/uL (ref 150.0–400.0)
RBC: 4.12 Mil/uL (ref 3.87–5.11)
RDW: 13.9 % (ref 11.5–15.5)
WBC: 4.1 10*3/uL (ref 4.0–10.5)

## 2022-05-19 LAB — IBC + FERRITIN
Ferritin: 28.3 ng/mL (ref 10.0–291.0)
Iron: 67 ug/dL (ref 42–145)
Saturation Ratios: 24.9 % (ref 20.0–50.0)
TIBC: 268.8 ug/dL (ref 250.0–450.0)
Transferrin: 192 mg/dL — ABNORMAL LOW (ref 212.0–360.0)

## 2022-05-19 LAB — LIPID PANEL
Cholesterol: 141 mg/dL (ref 0–200)
HDL: 49.3 mg/dL (ref >39.00)
LDL Cholesterol: 81 mg/dL (ref 0–99)
NonHDL: 91.43
Total CHOL/HDL Ratio: 3
Triglycerides: 54 mg/dL (ref 0.0–149.0)
VLDL: 10.8 mg/dL (ref 0.0–40.0)

## 2022-05-19 LAB — COMPREHENSIVE METABOLIC PANEL
ALT: 26 U/L (ref 0–35)
AST: 23 U/L (ref 0–37)
Albumin: 4.6 g/dL (ref 3.5–5.2)
Alkaline Phosphatase: 31 U/L — ABNORMAL LOW (ref 39–117)
BUN: 16 mg/dL (ref 6–23)
CO2: 25 mEq/L (ref 19–32)
Calcium: 9.2 mg/dL (ref 8.4–10.5)
Chloride: 108 mEq/L (ref 96–112)
Creatinine, Ser: 1 mg/dL (ref 0.40–1.20)
GFR: 74.75 mL/min (ref >60.00)
Glucose, Bld: 100 mg/dL — ABNORMAL HIGH (ref 70–99)
Potassium: 4.3 mEq/L (ref 3.5–5.1)
Sodium: 139 mEq/L (ref 135–145)
Total Bilirubin: 0.5 mg/dL (ref 0.2–1.2)
Total Protein: 6.8 g/dL (ref 6.0–8.3)

## 2022-05-19 LAB — RPR: RPR Ser Ql: NONREACTIVE

## 2022-05-19 LAB — HSV(HERPES SIMPLEX VRS) I + II AB-IGG
HAV 1 IGG,TYPE SPECIFIC AB: <0.9 index
HSV 2 IGG,TYPE SPECIFIC AB: <0.9 index

## 2022-05-19 LAB — TSH: TSH: 1.34 u[IU]/mL (ref 0.35–5.50)

## 2022-05-19 LAB — HIV ANTIBODY (ROUTINE TESTING W REFLEX): HIV 1&2 Ab, 4th Generation: NONREACTIVE

## 2022-05-22 LAB — URINE CYTOLOGY ANCILLARY ONLY
Chlamydia: NEGATIVE
Comment: NEGATIVE
Comment: NEGATIVE
Comment: NORMAL
Neisseria Gonorrhea: NEGATIVE
Trichomonas: NEGATIVE

## 2022-07-09 IMAGING — MR MR KNEE*L* W/O CM
6 series · 40 of 40 positions shown · non-contrast
Comparison: Radiographs 02/10/2021

CLINICAL DATA: Twisting injury left knee left knee pain
posteriorly.

EXAM:
MRI OF THE LEFT KNEE WITHOUT CONTRAST
TECHNIQUE: Multiplanar, multisequence MR imaging of the knee was performed. No
intravenous contrast was administered.

[Series 8: T2 fat-sat · axial · left · 4.0mm · 0.50mm/px · z∈[-63,+61]mm · 7 of 26 slices shown (1 of 3)]
[im 1/26]
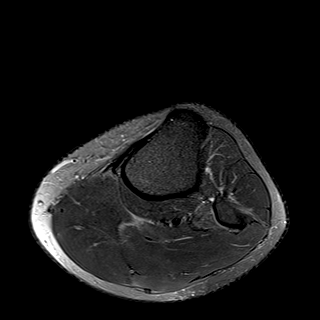
[im 5/26]
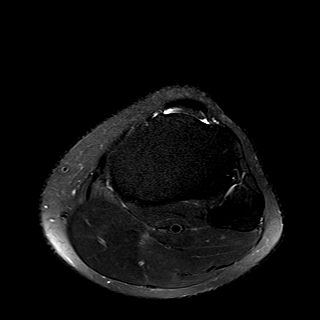
[im 9/26]
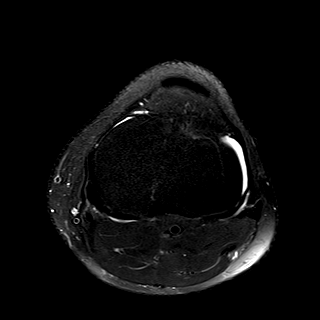
[im 13/26]
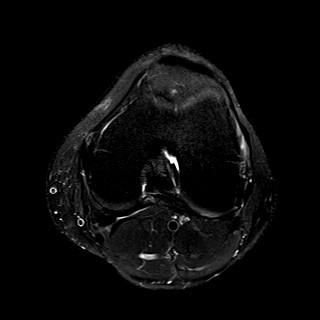
[im 17/26]
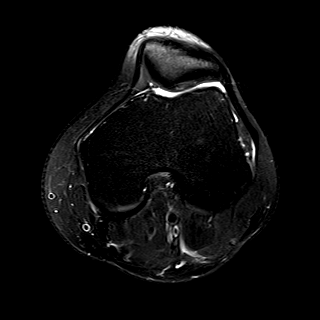
[im 21/26]
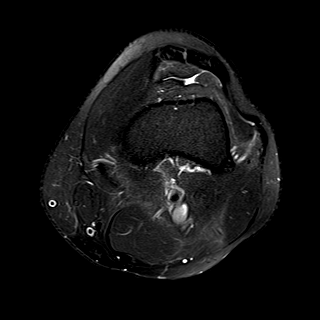
[im 26/26]
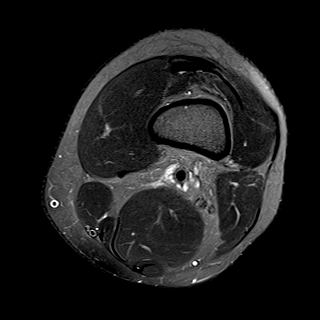

[Series 9: T2 fat-sat · coronal · left · 4.0mm · 0.59mm/px · 6 of 30 slices shown (2 of 3)]
[im 1/30]
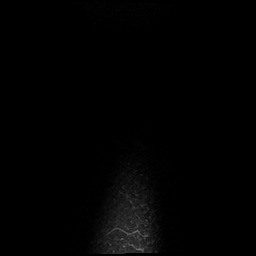
[im 6/30]
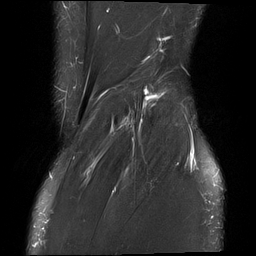
[im 12/30]
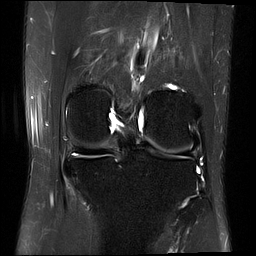
[im 18/30]
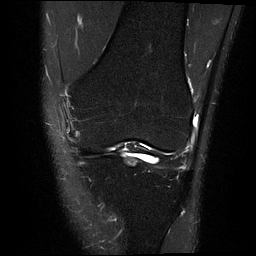
[im 24/30]
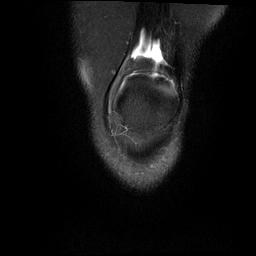
[im 30/30]
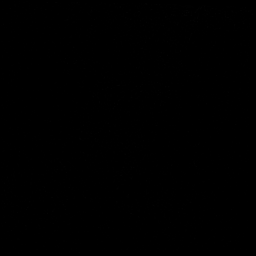

[Series 10: T1 · coronal · left · 4.0mm · 0.59mm/px · 6 of 30 slices shown]
[im 1/30]
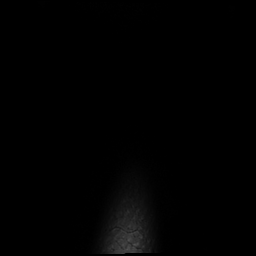
[im 6/30]
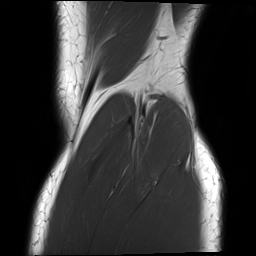
[im 12/30]
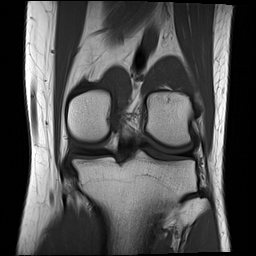
[im 18/30]
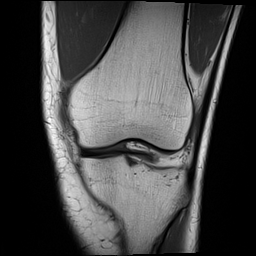
[im 24/30]
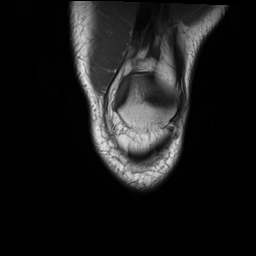
[im 30/30]
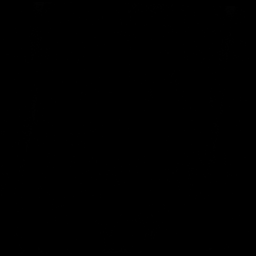

[Series 11: PD fat-sat · coronal · left · 4.0mm · 0.59mm/px · 6 of 30 slices shown (1 of 2)]
[im 1/30]
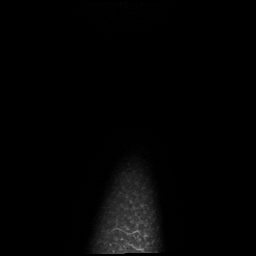
[im 6/30]
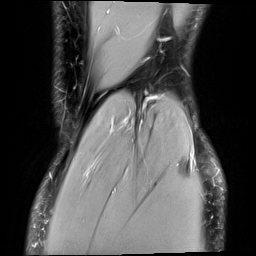
[im 12/30]
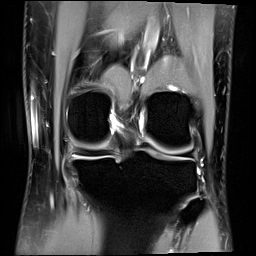
[im 18/30]
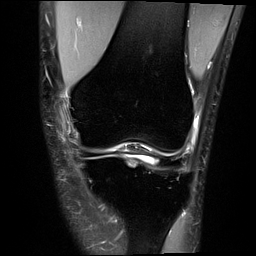
[im 24/30]
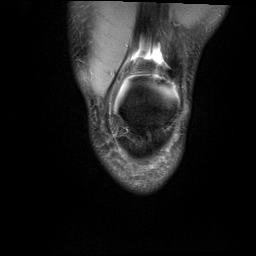
[im 30/30]
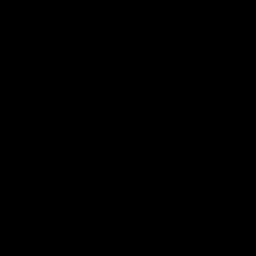

[Series 12: PD fat-sat · sagittal · left · 3.0mm · 0.59mm/px · 7 of 34 slices shown (2 of 2)]
[im 1/34]
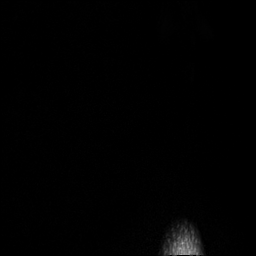
[im 6/34]
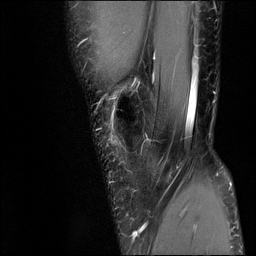
[im 12/34]
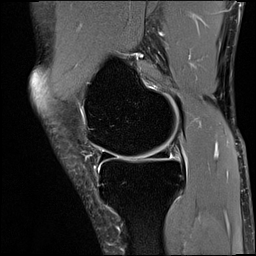
[im 17/34]
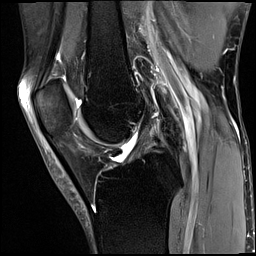
[im 23/34]
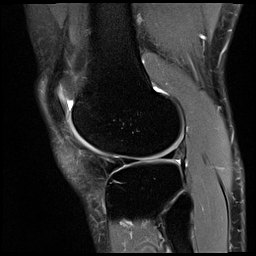
[im 28/34]
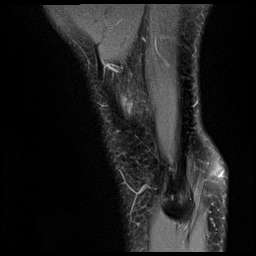
[im 34/34]
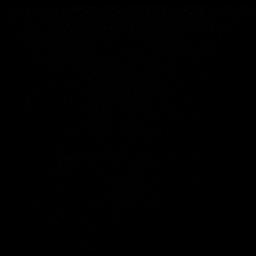

[Series 13: T2 fat-sat · sagittal · left · 3.0mm · 0.59mm/px · 8 of 36 slices shown (3 of 3)]
[im 1/36]
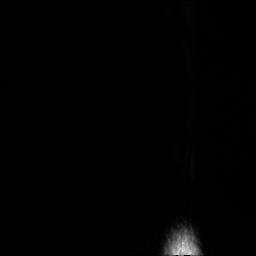
[im 6/36]
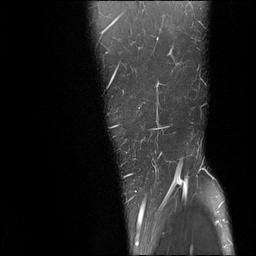
[im 11/36]
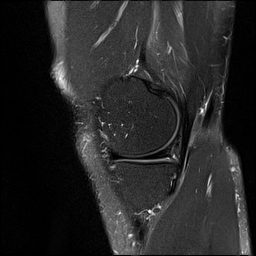
[im 16/36]
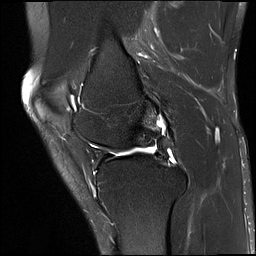
[im 21/36]
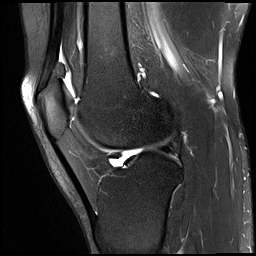
[im 26/36]
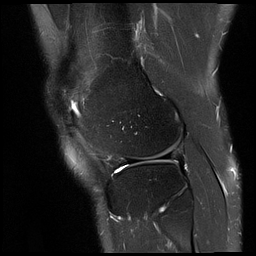
[im 31/36]
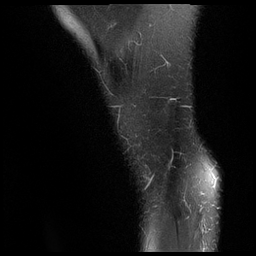
[im 36/36]
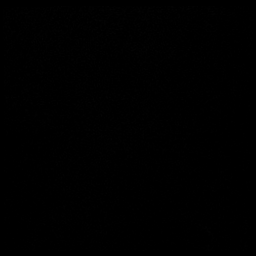

[40 of 40 positions shown; findings below may reference images not displayed]

FINDINGS: MENISCI

Medial meniscus:  Unremarkable

Lateral meniscus:  Unremarkable

LIGAMENTS

Cruciates:  Unremarkable

Collaterals:  Unremarkable

CARTILAGE

Patellofemoral:  Unremarkable

Medial:  Unremarkable

Lateral:  Unremarkable

Joint:  Small knee joint effusion.  Thin medial plica.

Popliteal Fossa:  Unremarkable

Extensor Mechanism:  Unremarkable

Bones: No significant extra-articular osseous abnormalities
identified.

Other: No supplemental non-categorized findings.
IMPRESSION: 1. Small knee joint effusion.
2. Thin medial plica.

## 2022-08-09 ENCOUNTER — Encounter: Payer: Self-pay | Admitting: Nurse Practitioner

## 2023-05-28 ENCOUNTER — Encounter: Payer: Self-pay | Admitting: Nurse Practitioner

## 2023-05-28 ENCOUNTER — Other Ambulatory Visit: Payer: Self-pay | Admitting: Nurse Practitioner

## 2023-05-28 ENCOUNTER — Other Ambulatory Visit: Payer: Self-pay

## 2023-05-28 DIAGNOSIS — F411 Generalized anxiety disorder: Secondary | ICD-10-CM

## 2023-05-28 MED ORDER — ESCITALOPRAM OXALATE 10 MG PO TABS: 10.0000 mg | ORAL_TABLET | Freq: Every day | ORAL | 0 refills | Status: AC

## 2023-05-28 NOTE — Telephone Encounter (Signed)
 Can we schedule the patient for a CPE within the next 60 days

## 2023-05-28 NOTE — Telephone Encounter (Signed)
 Patient scheduled. Awaiting Amy to add on.

## 2023-06-14 ENCOUNTER — Other Ambulatory Visit: Payer: Self-pay

## 2023-06-14 MED ORDER — ESCITALOPRAM OXALATE 10 MG PO TABS
10.0000 mg | ORAL_TABLET | Freq: Every day | ORAL | 0 refills | Status: DC
  Filled 2023-06-14: qty 90, 90d supply, fill #0

## 2023-06-15 ENCOUNTER — Other Ambulatory Visit: Payer: Self-pay

## 2023-07-18 ENCOUNTER — Encounter: Payer: Self-pay | Admitting: Nurse Practitioner

## 2023-07-18 ENCOUNTER — Ambulatory Visit (INDEPENDENT_AMBULATORY_CARE_PROVIDER_SITE_OTHER): Admitting: Nurse Practitioner

## 2023-07-18 VITALS — BP 120/62 | HR 50 | Temp 98.1°F | Ht 67.25 in | Wt 159.0 lb

## 2023-07-18 DIAGNOSIS — Z975 Presence of (intrauterine) contraceptive device: Secondary | ICD-10-CM

## 2023-07-18 DIAGNOSIS — Z0000 Encounter for general adult medical examination without abnormal findings: Secondary | ICD-10-CM

## 2023-07-18 DIAGNOSIS — D509 Iron deficiency anemia, unspecified: Secondary | ICD-10-CM

## 2023-07-18 DIAGNOSIS — R252 Cramp and spasm: Secondary | ICD-10-CM | POA: Insufficient documentation

## 2023-07-18 DIAGNOSIS — B360 Pityriasis versicolor: Secondary | ICD-10-CM | POA: Diagnosis not present

## 2023-07-18 DIAGNOSIS — F411 Generalized anxiety disorder: Secondary | ICD-10-CM

## 2023-07-18 MED ORDER — BUSPIRONE HCL 5 MG PO TABS: 5.0000 mg | ORAL_TABLET | Freq: Two times a day (BID) | ORAL | 1 refills | Status: DC

## 2023-07-18 NOTE — Assessment & Plan Note (Signed)
 Maintained on Lexapro  patient was having sexual dysfunction side effects along with weight gain.  We will discontinue Lexapro  she is currently taking 10 mg every other day she will do 5 mg every other day for a week we will start BuSpar 5 mg twice daily we will follow-up with her via MyChart in 4 weeks.  If she is not having adequate results we can increase BuSpar if BuSpar is not a good drug for her consider venlafaxine

## 2023-07-18 NOTE — Patient Instructions (Signed)
 Nice to see you today Take 5mg  of lexapro  every other day then stop Start taking Buspar We will reach out in 4 weeks to see how you are doing

## 2023-07-18 NOTE — Progress Notes (Signed)
 Established Patient Office Visit  Subjective   Patient ID: Jamie Weaver, female    DOB: 1990/03/30  Age: 33 y.o. MRN: [id number]  Chief Complaint  Patient presents with   Annual Exam    Hep c screening    HPI  for complete physical and follow up of chronic conditions.   GAD: Patient currently maintained on Lexapro  10 mg daily and hydroxyzine  25 mg twice daily as needed. States that when she first started it she was in a long distance relationshops and in long distance relatoinship. States that she stopped.states that she started back and was getting sexual side effectrs   Immunizations: -Tetanus: Completed in 2020 -Influenza: Out of season -Shingles: Too young -Pneumonia: Too young -COVID: Original series -HPV: Up-to-date  Diet: Fair diet. She is eating 2 meals a day and she will snack on some days. Water  Exercise: No regular exercise. She is doing 3 times a week at 1 hour with running and weights  Eye exam: Completes annually. Glasses and computer   Dental exam: Completes semi-annually    Colonoscopy: Completed in  Lung Cancer Screening: Completed in   Pap smear: 02/10/2021 normal cells and negative HPV  Mammogram: Too young, currently average risk  DEXA:  Sleep: going to bed around 9301-10 get up around 6-630. Most days she feels rested. Does not snore        Review of Systems  Constitutional:  Negative for chills and fever.  Respiratory:  Negative for shortness of breath.   Cardiovascular:  Negative for chest pain and leg swelling.  Gastrointestinal:  Negative for abdominal pain, blood in stool, constipation, diarrhea, nausea and vomiting.       BM daily   Genitourinary:  Negative for dysuria and hematuria.  Neurological:  Negative for dizziness, tingling and headaches.  Psychiatric/Behavioral:  Negative for hallucinations and suicidal ideas.       Objective:     BP 120/62   Pulse (!) 50   Temp 98.1 F (36.7 C) (Oral)   Ht 5' 7.25" (1.708  m)   Wt 159 lb (72.1 kg)   SpO2 99%   BMI 24.72 kg/m  BP Readings from Last 3 Encounters:  07/18/23 120/62  05/18/22 112/66  03/22/22 96/60   Wt Readings from Last 3 Encounters:  07/18/23 159 lb (72.1 kg)  05/18/22 145 lb 8 oz (66 kg)  03/22/22 149 lb 2 oz (67.6 kg)   SpO2 Readings from Last 3 Encounters:  07/18/23 99%  05/18/22 97%  03/22/22 97%      Physical Exam Vitals and nursing note reviewed.  Constitutional:      Appearance: Normal appearance.  HENT:     Right Ear: Tympanic membrane, ear canal and external ear normal.     Left Ear: Tympanic membrane, ear canal and external ear normal.     Mouth/Throat:     Mouth: Mucous membranes are moist.     Pharynx: Oropharynx is clear.  Eyes:     Extraocular Movements: Extraocular movements intact.     Pupils: Pupils are equal, round, and reactive to light.  Cardiovascular:     Rate and Rhythm: Normal rate and regular rhythm.     Pulses: Normal pulses.     Heart sounds: Normal heart sounds.  Pulmonary:     Effort: Pulmonary effort is normal.     Breath sounds: Normal breath sounds.  Abdominal:     General: Bowel sounds are normal. There is no distension.  Palpations: There is no mass.     Tenderness: There is no abdominal tenderness.     Hernia: No hernia is present.  Musculoskeletal:     Right lower leg: No edema.     Left lower leg: No edema.  Lymphadenopathy:     Cervical: No cervical adenopathy.  Skin:    General: Skin is warm.       Neurological:     General: No focal deficit present.     Mental Status: She is alert.     Deep Tendon Reflexes:     Reflex Scores:      Bicep reflexes are 2+ on the right side and 2+ on the left side.      Patellar reflexes are 2+ on the right side and 2+ on the left side.    Comments: Bilateral upper and lower extremity strength 5/5  Psychiatric:        Mood and Affect: Mood normal.        Behavior: Behavior normal.        Thought Content: Thought content normal.         Judgment: Judgment normal.      No results found for any visits on 07/18/23.    The ASCVD Risk score (Arnett DK, et al., 2019) failed to calculate for the following reasons:   The 2019 ASCVD risk score is only valid for ages 34 to 9    Assessment & Plan:   Problem List Items Addressed This Visit       Musculoskeletal and Integument   Tinea versicolor   Patient is been using over-the-counter treatments without great relief.  Ambulatory referral to dermatology      Relevant Orders   Ambulatory referral to Dermatology     Other   Iron deficiency anemia   History of the same pending CBC      IUD contraception   Patient states this time for removal.  Ambulatory referral to GYN      Relevant Orders   Ambulatory referral to Gynecology   Preventative health care - Primary   Discussed age-appropriate immunizations and screening exams.  Did review patient's personal, surgical, social, family histories.  Patient is up-to-date on all age-appropriate vaccinations she would like.  Patient is too young for CRC screening or breast cancer screening.  Patient is up-to-date on cervical cancer screening.  Ambulatory referral today to GYN for IUD removal.  Patient was given information at discharge about preventative healthcare maintenance with anticipatory guidance      Relevant Orders   Basic metabolic panel with GFR   TSH   GAD (generalized anxiety disorder)   Maintained on Lexapro  patient was having sexual dysfunction side effects along with weight gain.  We will discontinue Lexapro  she is currently taking 10 mg every other day she will do 5 mg every other day for a week we will start BuSpar 5 mg twice daily we will follow-up with her via MyChart in 4 weeks.  If she is not having adequate results we can increase BuSpar if BuSpar is not a good drug for her consider venlafaxine      Relevant Medications   busPIRone (BUSPAR) 5 MG tablet   Other Relevant Orders   TSH   Leg  cramping   Patient has been working on her hydration and oral magnesium replacement.  Pending magnesium level today      Relevant Orders   Magnesium   Basic metabolic panel with GFR   TSH  Return in about 1 year (around 07/17/2024) for CPE and Labs.    Jamie Shay, NP

## 2023-07-18 NOTE — Assessment & Plan Note (Signed)
 Patient is been using over-the-counter treatments without great relief.  Ambulatory referral to dermatology

## 2023-07-18 NOTE — Assessment & Plan Note (Signed)
 Patient states this time for removal.  Ambulatory referral to GYN

## 2023-07-18 NOTE — Assessment & Plan Note (Signed)
 History of the same pending CBC

## 2023-07-18 NOTE — Assessment & Plan Note (Signed)
 Discussed age-appropriate immunizations and screening exams.  Did review patient's personal, surgical, social, family histories.  Patient is up-to-date on all age-appropriate vaccinations she would like.  Patient is too young for CRC screening or breast cancer screening.  Patient is up-to-date on cervical cancer screening.  Ambulatory referral today to GYN for IUD removal.  Patient was given information at discharge about preventative healthcare maintenance with anticipatory guidance

## 2023-07-18 NOTE — Assessment & Plan Note (Signed)
 Patient has been working on her hydration and oral magnesium replacement.  Pending magnesium level today

## 2023-07-19 LAB — BASIC METABOLIC PANEL WITH GFR
BUN: 13 mg/dL (ref 6–23)
CO2: 25 meq/L (ref 19–32)
Calcium: 9.3 mg/dL (ref 8.4–10.5)
Chloride: 104 meq/L (ref 96–112)
Creatinine, Ser: 0.86 mg/dL (ref 0.40–1.20)
GFR: 88.85 mL/min (ref >60.00)
Glucose, Bld: 96 mg/dL (ref 70–99)
Potassium: 3.8 meq/L (ref 3.5–5.1)
Sodium: 140 meq/L (ref 135–145)

## 2023-07-19 LAB — TSH: TSH: 1.71 u[IU]/mL (ref 0.35–5.50)

## 2023-07-19 LAB — MAGNESIUM: Magnesium: 2 mg/dL (ref 1.5–2.5)

## 2023-07-23 ENCOUNTER — Ambulatory Visit: Payer: Self-pay | Admitting: Nurse Practitioner

## 2023-08-15 ENCOUNTER — Telehealth: Payer: Self-pay | Admitting: Nurse Practitioner

## 2023-08-15 NOTE — Telephone Encounter (Signed)
 See how she did coming off lexapro  and starting Buspar 

## 2023-08-15 NOTE — Telephone Encounter (Signed)
 Patient was referred to GYN. Has been a month and has not heard anything. Can we please check on this

## 2023-08-15 NOTE — Telephone Encounter (Signed)
-----   Message from [facility] sent at 07/18/2023  2:53 PM EDT ----- Regarding: GAD See how she did coming off lexapro  and starting Buspar

## 2023-08-15 NOTE — Telephone Encounter (Signed)
 Called pt. Pt states that she is doing better on buspar .  Asked if GYN will call her regarding appointment.  Advised to pt that they will reach out to her once referral process is complete.  No further questions or concerns.

## 2023-09-19 ENCOUNTER — Encounter: Payer: Self-pay | Admitting: Nurse Practitioner

## 2023-09-19 DIAGNOSIS — Z975 Presence of (intrauterine) contraceptive device: Secondary | ICD-10-CM

## 2023-09-19 DIAGNOSIS — R41840 Attention and concentration deficit: Secondary | ICD-10-CM

## 2023-09-27 NOTE — Addendum Note (Signed)
 Addended by: WENDEE LYNWOOD HERO on: 09/27/2023 03:48 PM   Modules accepted: Orders

## 2023-09-28 NOTE — Addendum Note (Signed)
 Addended by: WENDEE LYNWOOD HERO on: 09/28/2023 12:56 PM   Modules accepted: Orders

## 2023-10-08 ENCOUNTER — Encounter: Payer: Self-pay | Admitting: Nurse Practitioner

## 2023-11-01 DIAGNOSIS — B360 Pityriasis versicolor: Secondary | ICD-10-CM | POA: Diagnosis not present

## 2023-11-02 ENCOUNTER — Encounter: Payer: Self-pay | Admitting: Nurse Practitioner

## 2023-11-02 DIAGNOSIS — F411 Generalized anxiety disorder: Secondary | ICD-10-CM

## 2023-11-02 MED ORDER — BUSPIRONE HCL 10 MG PO TABS: 10.0000 mg | ORAL_TABLET | Freq: Two times a day (BID) | ORAL | 0 refills | Status: AC

## 2023-11-09 DIAGNOSIS — F411 Generalized anxiety disorder: Secondary | ICD-10-CM | POA: Diagnosis not present

## 2023-11-14 ENCOUNTER — Encounter: Payer: Self-pay | Admitting: Licensed Practical Nurse

## 2023-11-14 ENCOUNTER — Ambulatory Visit: Admitting: Licensed Practical Nurse

## 2023-11-14 VITALS — BP 112/73 | HR 76 | Ht 67.0 in | Wt 148.2 lb

## 2023-11-14 DIAGNOSIS — Z30433 Encounter for removal and reinsertion of intrauterine contraceptive device: Secondary | ICD-10-CM

## 2023-11-14 DIAGNOSIS — Z30430 Encounter for insertion of intrauterine contraceptive device: Secondary | ICD-10-CM

## 2023-11-14 DIAGNOSIS — Z30432 Encounter for removal of intrauterine contraceptive device: Secondary | ICD-10-CM

## 2023-11-14 MED ORDER — LEVONORGESTREL 20 MCG/DAY IU IUD
1.0000 | INTRAUTERINE_SYSTEM | Freq: Once | INTRAUTERINE | Status: AC
  Administered 2023-11-14: 1 via INTRAUTERINE

## 2023-11-14 NOTE — Progress Notes (Signed)
    SUBJECTIVE: Here for IUD removal and Inserted. The IUD was inserted at [facility] about 8 years ago    OBJECTIVE: BP 112/73 (BP Location: Right Arm, Patient Position: Sitting, Cuff Size: Normal)   Pulse 76   Ht 5' 7 (1.702 m)   Wt 148 lb 3.2 oz (67.2 kg)   BMI 23.21 kg/m   GEN: NAD   GYNECOLOGY OFFICE PROCEDURE NOTE  Jamie Weaver is a 33 y.o. No obstetric history on file. here for IUD removal and reinsertion. The patient currently has a Mirena IUD placed about 8 years ago after the birth of her daughter, which will be replaced with a Mirena IUD today.  No GYN concerns.  Last pap smear was on 2022 and was normal.  IUD Removal and Reinsertion  Patient identified, informed consent performed, consent signed.   Discussed risks of irregular bleeding, cramping, infection, malpositioning or uterine perforation of the IUD which may require further procedures. Time out was performed. Speculum placed in the vagina. The strings of the IUD were grasped and pulled using ring forceps. The IUD was successfully removed in its entirety. The cervix was cleaned with Betadine x 2 and grasped anteriorly with a single tooth tenaculum.  The uterus was sounded to IUD insertion apparatus was used to sound the uterus to 8 cm using a uterine sound.  The IUD was then placed per manufacturer's recommendations. Strings trimmed to 3 cm. Tenaculum was removed, good hemostasis noted. Patient tolerated procedure well.   Patient was given post-procedure instructions.  Patient was also asked to check IUD strings periodically and follow up in 6 weeks for IUD check.  Please schedule annual exam at your convenience    [name], [place]  [facility]

## 2023-11-26 DIAGNOSIS — F411 Generalized anxiety disorder: Secondary | ICD-10-CM | POA: Diagnosis not present
